# Patient Record
Sex: Male | Born: 1950 | Race: White | Hispanic: No | Marital: Married | Smoking: Never smoker
Health system: Southern US, Community
[De-identification: ages and names within clinical notes are randomized; demographics above are authoritative.]

## PROBLEM LIST (undated history)

## (undated) DIAGNOSIS — H269 Unspecified cataract: Secondary | ICD-10-CM

## (undated) DIAGNOSIS — R7303 Prediabetes: Secondary | ICD-10-CM

## (undated) DIAGNOSIS — Z8719 Personal history of other diseases of the digestive system: Secondary | ICD-10-CM

## (undated) DIAGNOSIS — Z9289 Personal history of other medical treatment: Secondary | ICD-10-CM

## (undated) DIAGNOSIS — T7840XA Allergy, unspecified, initial encounter: Secondary | ICD-10-CM

## (undated) DIAGNOSIS — M1990 Unspecified osteoarthritis, unspecified site: Secondary | ICD-10-CM

## (undated) HISTORY — DX: Unspecified cataract: H26.9

## (undated) HISTORY — PX: HAND SURGERY: SHX662

## (undated) HISTORY — PX: BUNIONECTOMY: SHX129

## (undated) HISTORY — PX: HAMMER TOE SURGERY: SHX385

## (undated) HISTORY — PX: COLONOSCOPY: SHX174

## (undated) HISTORY — PX: TONSILLECTOMY: SUR1361

## (undated) HISTORY — DX: Allergy, unspecified, initial encounter: T78.40XA

## (undated) HISTORY — PX: EYE SURGERY: SHX253

## (undated) HISTORY — PX: JOINT REPLACEMENT: SHX530

## (undated) HISTORY — PX: KNEE ARTHROSCOPY: SHX127

## (undated) HISTORY — PX: OTHER SURGICAL HISTORY: SHX169

---

## 1981-09-03 DIAGNOSIS — Z9289 Personal history of other medical treatment: Secondary | ICD-10-CM

## 1981-09-03 HISTORY — DX: Personal history of other medical treatment: Z92.89

## 1981-09-03 HISTORY — PX: OTHER SURGICAL HISTORY: SHX169

## 2008-09-03 HISTORY — PX: KNEE ARTHROPLASTY: SHX992

## 2009-03-19 ENCOUNTER — Emergency Department (HOSPITAL_BASED_OUTPATIENT_CLINIC_OR_DEPARTMENT_OTHER): Admission: EM | Admit: 2009-03-19 | Discharge: 2009-03-20 | Payer: Self-pay | Admitting: Emergency Medicine

## 2009-03-19 ENCOUNTER — Ambulatory Visit: Payer: Self-pay | Admitting: Diagnostic Radiology

## 2009-05-02 ENCOUNTER — Inpatient Hospital Stay (HOSPITAL_COMMUNITY): Admission: RE | Admit: 2009-05-02 | Discharge: 2009-05-06 | Payer: Self-pay | Admitting: Orthopedic Surgery

## 2009-05-02 ENCOUNTER — Encounter (INDEPENDENT_AMBULATORY_CARE_PROVIDER_SITE_OTHER): Payer: Self-pay | Admitting: Orthopedic Surgery

## 2009-05-13 ENCOUNTER — Encounter (INDEPENDENT_AMBULATORY_CARE_PROVIDER_SITE_OTHER): Payer: Self-pay | Admitting: Orthopedic Surgery

## 2009-05-13 ENCOUNTER — Ambulatory Visit: Payer: Self-pay | Admitting: Surgery

## 2009-05-13 ENCOUNTER — Ambulatory Visit (HOSPITAL_COMMUNITY): Admission: RE | Admit: 2009-05-13 | Discharge: 2009-05-13 | Payer: Self-pay | Admitting: Orthopedic Surgery

## 2010-12-08 LAB — HEMOGLOBIN AND HEMATOCRIT, BLOOD: Hemoglobin: 12.1 g/dL — ABNORMAL LOW (ref 13.0–17.0)

## 2010-12-08 LAB — PROTIME-INR
INR: 2.1 — ABNORMAL HIGH (ref 0.00–1.49)
INR: 2.6 — ABNORMAL HIGH (ref 0.00–1.49)
Prothrombin Time: 23.2 seconds — ABNORMAL HIGH (ref 11.6–15.2)
Prothrombin Time: 27.8 seconds — ABNORMAL HIGH (ref 11.6–15.2)

## 2010-12-09 LAB — CBC
Platelets: 199 10*3/uL (ref 150–400)
RBC: 5.11 MIL/uL (ref 4.22–5.81)
WBC: 4.3 10*3/uL (ref 4.0–10.5)

## 2010-12-09 LAB — DIFFERENTIAL
Basophils Absolute: 0 10*3/uL (ref 0.0–0.1)
Eosinophils Absolute: 0.2 10*3/uL (ref 0.0–0.7)
Eosinophils Relative: 4 % (ref 0–5)
Lymphocytes Relative: 45 % (ref 12–46)
Lymphs Abs: 1.9 10*3/uL (ref 0.7–4.0)
Monocytes Absolute: 0.4 10*3/uL (ref 0.1–1.0)

## 2010-12-09 LAB — TYPE AND SCREEN
ABO/RH(D): B POS
Antibody Screen: NEGATIVE

## 2010-12-09 LAB — ABO/RH: ABO/RH(D): B POS

## 2010-12-09 LAB — URINALYSIS, ROUTINE W REFLEX MICROSCOPIC
Glucose, UA: NEGATIVE mg/dL
Hgb urine dipstick: NEGATIVE
pH: 5.5 (ref 5.0–8.0)

## 2010-12-09 LAB — COMPREHENSIVE METABOLIC PANEL
ALT: 26 U/L (ref 0–53)
AST: 26 U/L (ref 0–37)
Albumin: 4 g/dL (ref 3.5–5.2)
CO2: 27 mEq/L (ref 19–32)
Chloride: 105 mEq/L (ref 96–112)
Creatinine, Ser: 1.26 mg/dL (ref 0.4–1.5)
GFR calc Af Amer: >60 mL/min (ref >60)
GFR calc non Af Amer: 59 mL/min — ABNORMAL LOW (ref >60)
Sodium: 137 mEq/L (ref 135–145)
Total Bilirubin: 1 mg/dL (ref 0.3–1.2)

## 2010-12-09 LAB — PROTIME-INR: Prothrombin Time: 12.9 seconds (ref 11.6–15.2)

## 2010-12-09 LAB — HEMOGLOBIN AND HEMATOCRIT, BLOOD: Hemoglobin: 13.9 g/dL (ref 13.0–17.0)

## 2010-12-10 LAB — DIFFERENTIAL
Basophils Relative: 0 % (ref 0–1)
Eosinophils Relative: 4 % (ref 0–5)
Monocytes Absolute: 0.5 10*3/uL (ref 0.1–1.0)
Monocytes Relative: 9 % (ref 3–12)
Neutro Abs: 2.6 10*3/uL (ref 1.7–7.7)

## 2010-12-10 LAB — BASIC METABOLIC PANEL
CO2: 28 mEq/L (ref 19–32)
Calcium: 8.9 mg/dL (ref 8.4–10.5)
Chloride: 104 mEq/L (ref 96–112)
GFR calc Af Amer: >60 mL/min (ref >60)
Glucose, Bld: 90 mg/dL (ref 70–99)
Potassium: 3.8 mEq/L (ref 3.5–5.1)
Sodium: 142 mEq/L (ref 135–145)

## 2010-12-10 LAB — POCT CARDIAC MARKERS
CKMB, poc: 1.1 ng/mL (ref 1.0–8.0)
CKMB, poc: <1 ng/mL — ABNORMAL LOW (ref 1.0–8.0)
Myoglobin, poc: 41.2 ng/mL (ref 12–200)
Troponin i, poc: <0.05 ng/mL (ref 0.00–0.09)

## 2010-12-10 LAB — CBC
HCT: 48.2 % (ref 39.0–52.0)
Hemoglobin: 16.2 g/dL (ref 13.0–17.0)
MCHC: 33.5 g/dL (ref 30.0–36.0)
MCV: 87.7 fL (ref 78.0–100.0)
RBC: 5.5 MIL/uL (ref 4.22–5.81)

## 2011-01-16 NOTE — Op Note (Signed)
Anthony Kane, Anthony Kane              ACCOUNT NO.:  [id number]   MEDICAL RECORD NO.:  [id number]           PATIENT TYPE:   LOCATION:                                 FACILITY:   PHYSICIAN:  Georges Lynch. Gioffre, M.D.DATE OF BIRTH:  November 30, 1950   DATE OF PROCEDURE:  DATE OF DISCHARGE:                               OPERATIVE REPORT   ASSISTANT:  Dr. Marlowe Kays MD and Rozell Searing, PA.   PREOPERATIVE DIAGNOSES:  1. Large popliteal cyst migrating out medially the joint that had      previously been excised by another surgeon x2.  2. Degenerative arthritis of the right knee.   POSTOPERATIVE DIAGNOSES:  1. Large popliteal cyst migrating out medially the joint that had      previously been excised by another surgeon x2.  2. Degenerative arthritis of the right knee.   OPERATION:  1. Excision of a large popliteal cyst from the medial approach, right      knee.  2. Right total knee arthroplasty utilizing DePuy system.  Sizes used:      The patella 41-mm three-pronged patella.  The tibial tray was a      size 5 cemented, the femoral component size 5 right posterior      stabilized, the insert was a size 5, 10-mm thickness insert      rotating platform type.  In the cement we used gentamicin.   PROCEDURE:  Under general anesthesia routine orthopedic prep and draping  of the right knee was carried out.  The patient given 2 g of IV Ancef  preop.  At this time the leg was exsanguinated with an Esmarch.  Tourniquet was elevated at 350 mmHg.  The knee was flexed.  An incision  was made over the anterior aspect of the left knee.  Bleeders identified  and cauterized.  I dissected medially into the soft tissue area, went  back and excised the large ganglion cyst that was very unusual  presentation since it presented medially.   We then did a median parapatellar incision reflecting the patella  laterally and carried out medial lateral meniscectomy to excise the  anterior and posterior cruciate  ligaments.  Note the anterior cruciate  actually was completely degenerated.  Following that I made my initial  drill hole in the intercondylar notch.  A #1 jig was inserted.  I  removed 12-mm thickness off the distal femur.  At that time we measured  the femur to be a size 5.  At this point we then cut the femur for a  size 5 femur.  We did our anterior-posterior chamfer cuts in the usual  fashion.  Following that we then prepared our tibia for a size 5 tibia.  We utilized the intramedullary guide and I removed 4-mm thickness off  the affected lateral side.  He did have a valgus knee.  At this time we  made sure that the tibial plateau was nice and even.  We measured the  tibial plateau to be a size 5.  We made an initial drill hole in the  tibia followed by the  keel cut out of the proximal tibia.  Before we cut  our notch cut of the femur I checked for any posterior spurs.  We then  began our tension, utilized our tension guides, and made sure we had  good tension in flexion and extension.  Following that I then cut my  notch cut of the distal femur in the usual fashion.  We then inserted  our trial components.  We had excellent stability and excellent tension  utilizing the 10-mm thickness tibial insert with a trial components in I  then did a resurfacing procedure on the patella in the usual fashion.  Three drill holes were made in the patella for a size 41 patella.   I thoroughly irrigated out the area and then removed all trial  components, thoroughly irrigated out the area again, dried the area out,  cemented all three components in simultaneously.  By the way, we used  gentamicin in the cement.  Once the cement was hardened, all loose  pieces of cement were removed.  We checked posteriorly as well.  I  injected 10 mL of FloSeal into the knee after we irrigated the knee out.  I then bone waxed the bleeding bone ends of the femur.  We then checked  again and finally selected the  10-mm thickness tibial insert.   Following that we irrigated the area out and then went ahead and  inserted a Hemovac drain, closed the knee in layered usual fashion.  Sterile Neosporin dressings were applied.  The patient had 2 g of IV  Ancef preop.  Note when we did our ganglion cystectomy I sent the cyst  down to pathology.  We then dressed the wounds as I mentioned.  The  patient left the operating room in satisfactory condition.           ______________________________  Georges Lynch Darrelyn Hillock, M.D.     RAG/MEDQ  D:  05/02/2009  T:  05/02/2009  Job:  [id number]

## 2013-04-22 ENCOUNTER — Other Ambulatory Visit (HOSPITAL_COMMUNITY): Payer: Self-pay | Admitting: Orthopedic Surgery

## 2013-04-22 NOTE — Patient Instructions (Addendum)
20 Anthony Kane  04/22/2013   Your procedure is scheduled on: 05-08-2013  Report to [facility] at  800 AM.  Call this number if you have problems the morning of surgery [phone]   Remember:   Do not eat food or drink liquids :After Midnight.     Take these medicines the morning of surgery with A SIP OF WATER: no meds to take                                SEE [facility] PREPARING FOR SURGERY SHEET   Do not wear jewelry, make-up or nail polish.  Do not wear lotions, powders, or perfumes. You may wear deodorant.   Men may shave face and neck.  Do not bring valuables to the hospital. [facility] IS NOT RESPONSIBLE FOR VALUEABLES.  Contacts, dentures or bridgework may not be worn into surgery.  Leave suitcase in the car. After surgery it may be brought to your room.  For patients admitted to the hospital, checkout time is 11:00 AM the day of discharge.   Patients discharged the day of surgery will not be allowed to drive home.  Name and phone number of your driver:  Special Instructions: N/A   Please read over the following fact sheets that you were given:   Call Cain Sieve RN pre op nurse if needed [phone]    FAILURE TO FOLLOW THESE INSTRUCTIONS MAY RESULT IN THE CANCELLATION OF YOUR SURGERY.  PATIENT SIGNATURE___________________________________________  NURSE SIGNATURE_____________________________________________

## 2013-04-23 ENCOUNTER — Encounter (HOSPITAL_COMMUNITY): Payer: Self-pay

## 2013-04-23 ENCOUNTER — Encounter (HOSPITAL_COMMUNITY): Payer: Self-pay | Admitting: Pharmacy Technician

## 2013-04-23 ENCOUNTER — Encounter (HOSPITAL_COMMUNITY)
Admission: RE | Admit: 2013-04-23 | Discharge: 2013-04-23 | Disposition: A | Discharge status: Home / Self Care | Payer: BC Managed Care – PPO | Source: Ambulatory Visit | Attending: Orthopedic Surgery | Admitting: Orthopedic Surgery

## 2013-04-23 ENCOUNTER — Ambulatory Visit (HOSPITAL_COMMUNITY)
Admission: RE | Admit: 2013-04-23 | Discharge: 2013-04-23 | Disposition: A | Discharge status: Home / Self Care | Payer: BC Managed Care – PPO | Source: Ambulatory Visit | Attending: Surgical | Admitting: Surgical

## 2013-04-23 DIAGNOSIS — Z01818 Encounter for other preprocedural examination: Secondary | ICD-10-CM | POA: Insufficient documentation

## 2013-04-23 DIAGNOSIS — Z01810 Encounter for preprocedural cardiovascular examination: Secondary | ICD-10-CM | POA: Insufficient documentation

## 2013-04-23 HISTORY — DX: Personal history of other medical treatment: Z92.89

## 2013-04-23 HISTORY — DX: Unspecified osteoarthritis, unspecified site: M19.90

## 2013-04-23 LAB — CBC
HCT: 44 % (ref 39.0–52.0)
MCHC: 35.2 g/dL (ref 30.0–36.0)
MCV: 82.1 fL (ref 78.0–100.0)
Platelets: 227 10*3/uL (ref 150–400)
RDW: 12.4 % (ref 11.5–15.5)

## 2013-04-23 LAB — URINALYSIS, ROUTINE W REFLEX MICROSCOPIC
Bilirubin Urine: NEGATIVE
Glucose, UA: NEGATIVE mg/dL
Hgb urine dipstick: NEGATIVE
Ketones, ur: NEGATIVE mg/dL
Leukocytes, UA: NEGATIVE
Nitrite: NEGATIVE
Protein, ur: NEGATIVE mg/dL
Specific Gravity, Urine: 1.024 (ref 1.005–1.030)
Urobilinogen, UA: 0.2 mg/dL (ref 0.0–1.0)
pH: 5.5 (ref 5.0–8.0)

## 2013-04-23 LAB — COMPREHENSIVE METABOLIC PANEL
ALT: 22 U/L (ref 0–53)
AST: 20 U/L (ref 0–37)
Albumin: 3.9 g/dL (ref 3.5–5.2)
Alkaline Phosphatase: 76 U/L (ref 39–117)
BUN: 16 mg/dL (ref 6–23)
CO2: 24 mEq/L (ref 19–32)
Calcium: 9 mg/dL (ref 8.4–10.5)
Chloride: 100 mEq/L (ref 96–112)
Creatinine, Ser: 1.21 mg/dL (ref 0.50–1.35)
GFR calc Af Amer: 73 mL/min — ABNORMAL LOW (ref >90)
GFR calc non Af Amer: 63 mL/min — ABNORMAL LOW (ref >90)
Glucose, Bld: 108 mg/dL — ABNORMAL HIGH (ref 70–99)
Potassium: 4.1 mEq/L (ref 3.5–5.1)
Sodium: 133 mEq/L — ABNORMAL LOW (ref 135–145)
Total Bilirubin: 0.4 mg/dL (ref 0.3–1.2)
Total Protein: 6.6 g/dL (ref 6.0–8.3)

## 2013-04-23 LAB — APTT: aPTT: 30 seconds (ref 24–37)

## 2013-04-23 LAB — PROTIME-INR
INR: 0.95 (ref 0.00–1.49)
Prothrombin Time: 12.5 seconds (ref 11.6–15.2)

## 2013-05-05 NOTE — H&P (Signed)
Anthony Kane is an 62 y.o. male.   Chief Complaint: right shoulder pain HPI: Anthony Kane is a 62 year old male who presented with right shoulder pain. He has been followed for her bilateral shoulder pain for several months. He has developed weakness and increased pain with no known injury over the last 2 months. He has had cortisone injections in the past which have relieved his symptoms. Unfortunately, the cortisone injections have become ineffective for his right shoulder. MRI of the right shoulder revealed that he has a 4.9 x 5.1 cm full thickness supraspinatus and infraspinatus tear. He has a complete rupture of the biceps tendon which is old , that tendon has reattached itself distally.   Past Medical History  Diagnosis Date  . Arthritis   . History of blood transfusion 1983    Past Surgical History  Procedure Laterality Date  . 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 Hand surgery Right     x 9  . Arthroscopy of knee Bilateral     2 x each knee  . Joint replacement Right 2011 or 2010  . Bunion surgery Right   . Hammer toe surgery Right   . Eye surgery Right     intraocular lens  . Tonsillectomy  age 41    Social History:  reports that he has never smoked. He has never used smokeless tobacco. He reports that he does not drink alcohol or use illicit drugs.  Allergies: No Known Allergies   Current outpatient prescriptions: carboxymethylcellulose (REFRESH PLUS) 0.5 % SOLN, Place 1 drop into both eyes 3 (three) times daily as needed (dry eyes)., Disp: , Rfl: ;  cholecalciferol (VITAMIN D) 1000 UNITS tablet, Take 2,000 Units by mouth daily., Disp: , Rfl: ;  fish oil-omega-3 fatty acids 1000 MG capsule, Take 2 g by mouth daily., Disp: , Rfl:  ibuprofen (ADVIL,MOTRIN) 200 MG tablet, Take 400-800 mg by mouth every 6 (six) hours as needed for pain., Disp: , Rfl: ;  Linoleic Acid Conjugated (CLA PO), Take 1 tablet by mouth daily., Disp: , Rfl: ;  Multiple Vitamin (MULTIVITAMIN WITH  MINERALS) TABS tablet, Take 1 tablet by mouth daily., Disp: , Rfl: ;  OVER THE COUNTER MEDICATION, Take 1 tablet by mouth daily. Fruit and vegetable supplement, Disp: , Rfl:   Review of Systems  Constitutional: Negative.   HENT: Negative.  Negative for neck pain.   Eyes: Negative.   Respiratory: Negative.   Cardiovascular: Negative.   Gastrointestinal: Negative.   Genitourinary: Negative.   Musculoskeletal: Positive for myalgias and joint pain. Negative for back pain and falls.       Right shoulder pain  Skin: Negative.   Neurological: Negative.   Endo/Heme/Allergies: Negative.   Psychiatric/Behavioral: Negative.     Physical Exam  Constitutional: He is oriented to person, place, and time. He appears well-developed and well-nourished. No distress.  HENT:  Head: Normocephalic and atraumatic.  Right Ear: External ear normal.  Left Ear: External ear normal.  Nose: Nose normal.  Mouth/Throat: Oropharynx is clear and moist.  Eyes: Conjunctivae and EOM are normal.  Neck: Normal range of motion. Neck supple. No tracheal deviation present. No thyromegaly present.  Cardiovascular: Normal rate, regular rhythm, normal heart sounds and intact distal pulses.   No murmur heard. Respiratory: Effort normal and breath sounds normal. No respiratory distress. He has no wheezes.  GI: Soft. Bowel sounds are normal. He exhibits no distension. There is no tenderness.  Musculoskeletal:  Right shoulder: He exhibits decreased range of motion, tenderness, pain and decreased strength.       Left shoulder: He exhibits pain. He exhibits normal range of motion, no tenderness and normal strength.       Right elbow: Normal.      Left elbow: Normal.       Right wrist: Normal.       Left wrist: Normal.       Cervical back: Normal.  Decreased strength in abduction. Popeye deformity in the right UE due to biceps tendon tear  Lymphadenopathy:    He has no cervical adenopathy.  Neurological: He is alert and  oriented to person, place, and time. He has normal reflexes. No sensory deficit.  Skin: No rash noted. He is not diaphoretic. No erythema.  Psychiatric: He has a normal mood and affect. His behavior is normal.     Assessment/Plan Right shoulder, rotator cuff tear and biceps tendon tear He needs to have that biceps tendon opened and repaired. We may need to use a tendon graft. Dr. Darrelyn Hillock may have to use anchors to anchor that back to the bone. The overall success rate depends totally on how well that tendon presents. In other words if it is in good enough shape to reattach. The tendon could be all arthritic and there could not be much good tendon left to repair. Possible other complications could be infection which is rare. He will stay overnight for this procedure.   Alysabeth Scalia LAUREN 05/05/2013, 9:11 AM

## 2013-05-08 ENCOUNTER — Encounter (HOSPITAL_COMMUNITY): Payer: Self-pay | Admitting: Anesthesiology

## 2013-05-08 ENCOUNTER — Ambulatory Visit (HOSPITAL_COMMUNITY): Payer: BC Managed Care – PPO | Admitting: Anesthesiology

## 2013-05-08 ENCOUNTER — Observation Stay (HOSPITAL_COMMUNITY)
Admission: RE | Admit: 2013-05-08 | Discharge: 2013-05-09 | Disposition: A | Discharge status: Home / Self Care | Payer: BC Managed Care – PPO | Source: Ambulatory Visit | Attending: Orthopedic Surgery | Admitting: Orthopedic Surgery

## 2013-05-08 ENCOUNTER — Encounter (HOSPITAL_COMMUNITY)
Admission: RE | Disposition: A | Discharge status: Home / Self Care | Payer: Self-pay | Source: Ambulatory Visit | Attending: Orthopedic Surgery

## 2013-05-08 DIAGNOSIS — M719 Bursopathy, unspecified: Principal | ICD-10-CM | POA: Insufficient documentation

## 2013-05-08 DIAGNOSIS — M67919 Unspecified disorder of synovium and tendon, unspecified shoulder: Principal | ICD-10-CM | POA: Insufficient documentation

## 2013-05-08 DIAGNOSIS — M75120 Complete rotator cuff tear or rupture of unspecified shoulder, not specified as traumatic: Secondary | ICD-10-CM | POA: Diagnosis present

## 2013-05-08 DIAGNOSIS — S68118A Complete traumatic metacarpophalangeal amputation of other finger, initial encounter: Secondary | ICD-10-CM | POA: Insufficient documentation

## 2013-05-08 DIAGNOSIS — M25819 Other specified joint disorders, unspecified shoulder: Secondary | ICD-10-CM | POA: Insufficient documentation

## 2013-05-08 DIAGNOSIS — E669 Obesity, unspecified: Secondary | ICD-10-CM | POA: Insufficient documentation

## 2013-05-08 DIAGNOSIS — M75121 Complete rotator cuff tear or rupture of right shoulder, not specified as traumatic: Secondary | ICD-10-CM

## 2013-05-08 HISTORY — PX: SHOULDER OPEN ROTATOR CUFF REPAIR: SHX2407

## 2013-05-08 SURGERY — REPAIR, ROTATOR CUFF, OPEN
Anesthesia: General | Site: Shoulder | Laterality: Right | Wound class: Clean

## 2013-05-08 MED ORDER — THROMBIN 5000 UNITS EX SOLR
CUTANEOUS | Status: AC
  Filled 2013-05-08: qty 5000

## 2013-05-08 MED ORDER — PROPOFOL 10 MG/ML IV BOLUS
INTRAVENOUS | Status: DC | PRN
  Administered 2013-05-08: 200 mg via INTRAVENOUS

## 2013-05-08 MED ORDER — HYDROMORPHONE HCL PF 1 MG/ML IJ SOLN
INTRAMUSCULAR | Status: AC
  Filled 2013-05-08: qty 1

## 2013-05-08 MED ORDER — NEOSTIGMINE METHYLSULFATE 1 MG/ML IJ SOLN
INTRAMUSCULAR | Status: DC | PRN
  Administered 2013-05-08: 4 mg via INTRAVENOUS

## 2013-05-08 MED ORDER — LACTATED RINGERS IV SOLN
INTRAVENOUS | Status: DC
  Administered 2013-05-08: 10:00:00 via INTRAVENOUS

## 2013-05-08 MED ORDER — CISATRACURIUM BESYLATE (PF) 10 MG/5ML IV SOLN
INTRAVENOUS | Status: DC | PRN
  Administered 2013-05-08: 6 mg via INTRAVENOUS

## 2013-05-08 MED ORDER — CEFAZOLIN SODIUM 1-5 GM-% IV SOLN
1.0000 g | Freq: Four times a day (QID) | INTRAVENOUS | Status: AC
  Administered 2013-05-08 – 2013-05-09 (×3): 1 g via INTRAVENOUS
  Filled 2013-05-08 (×3): qty 50

## 2013-05-08 MED ORDER — CEFAZOLIN SODIUM-DEXTROSE 2-3 GM-% IV SOLR
INTRAVENOUS | Status: AC
  Filled 2013-05-08: qty 50

## 2013-05-08 MED ORDER — BUPIVACAINE LIPOSOME 1.3 % IJ SUSP
20.0000 mL | Freq: Once | INTRAMUSCULAR | Status: DC
  Filled 2013-05-08: qty 20

## 2013-05-08 MED ORDER — SODIUM CHLORIDE 0.9 % IR SOLN
Status: DC | PRN
  Administered 2013-05-08: 11:00:00

## 2013-05-08 MED ORDER — METHOCARBAMOL 500 MG PO TABS
500.0000 mg | ORAL_TABLET | Freq: Four times a day (QID) | ORAL | Status: DC | PRN
  Administered 2013-05-08 – 2013-05-09 (×2): 500 mg via ORAL
  Filled 2013-05-08 (×2): qty 1

## 2013-05-08 MED ORDER — OXYCODONE-ACETAMINOPHEN 5-325 MG PO TABS
1.0000 | ORAL_TABLET | ORAL | Status: DC | PRN
  Administered 2013-05-08 – 2013-05-09 (×2): 2 via ORAL
  Filled 2013-05-08 (×2): qty 2

## 2013-05-08 MED ORDER — MIDAZOLAM HCL 5 MG/5ML IJ SOLN
INTRAMUSCULAR | Status: DC | PRN
  Administered 2013-05-08: 2 mg via INTRAVENOUS

## 2013-05-08 MED ORDER — GLYCOPYRROLATE 0.2 MG/ML IJ SOLN
INTRAMUSCULAR | Status: DC | PRN
  Administered 2013-05-08: .6 mg via INTRAVENOUS

## 2013-05-08 MED ORDER — ONDANSETRON HCL 4 MG/2ML IJ SOLN: 4.0000 mg | Freq: Four times a day (QID) | INTRAMUSCULAR | Status: DC | PRN

## 2013-05-08 MED ORDER — ONDANSETRON HCL 4 MG PO TABS: 4.0000 mg | ORAL_TABLET | Freq: Four times a day (QID) | ORAL | Status: DC | PRN

## 2013-05-08 MED ORDER — HYDROMORPHONE HCL PF 1 MG/ML IJ SOLN
0.5000 mg | INTRAMUSCULAR | Status: DC | PRN
  Administered 2013-05-08: 1 mg via INTRAVENOUS
  Filled 2013-05-08: qty 1

## 2013-05-08 MED ORDER — ONDANSETRON HCL 4 MG/2ML IJ SOLN
INTRAMUSCULAR | Status: DC | PRN
  Administered 2013-05-08: 4 mg via INTRAVENOUS

## 2013-05-08 MED ORDER — HYDROCODONE-ACETAMINOPHEN 7.5-325 MG PO TABS
1.0000 | ORAL_TABLET | ORAL | Status: DC | PRN
  Administered 2013-05-08 – 2013-05-09 (×2): 2 via ORAL
  Filled 2013-05-08 (×2): qty 2

## 2013-05-08 MED ORDER — FENTANYL CITRATE 0.05 MG/ML IJ SOLN
INTRAMUSCULAR | Status: DC | PRN
  Administered 2013-05-08: 50 ug via INTRAVENOUS
  Administered 2013-05-08: 100 ug via INTRAVENOUS
  Administered 2013-05-08 (×2): 50 ug via INTRAVENOUS

## 2013-05-08 MED ORDER — LACTATED RINGERS IV SOLN
INTRAVENOUS | Status: DC
  Administered 2013-05-08 – 2013-05-09 (×2): via INTRAVENOUS

## 2013-05-08 MED ORDER — CEFAZOLIN SODIUM-DEXTROSE 2-3 GM-% IV SOLR
2.0000 g | INTRAVENOUS | Status: AC
  Administered 2013-05-08: 2 g via INTRAVENOUS

## 2013-05-08 MED ORDER — THROMBIN 5000 UNITS EX SOLR
OROMUCOSAL | Status: DC | PRN
  Administered 2013-05-08: 12:00:00 via TOPICAL

## 2013-05-08 MED ORDER — ACETAMINOPHEN 325 MG PO TABS: 650.0000 mg | ORAL_TABLET | Freq: Four times a day (QID) | ORAL | Status: DC | PRN

## 2013-05-08 MED ORDER — METHOCARBAMOL 100 MG/ML IJ SOLN
500.0000 mg | Freq: Four times a day (QID) | INTRAVENOUS | Status: DC | PRN
  Administered 2013-05-08: 500 mg via INTRAVENOUS
  Filled 2013-05-08: qty 5

## 2013-05-08 MED ORDER — HYDROMORPHONE HCL PF 1 MG/ML IJ SOLN
0.2500 mg | INTRAMUSCULAR | Status: DC | PRN
  Administered 2013-05-08 (×3): 0.5 mg via INTRAVENOUS

## 2013-05-08 MED ORDER — PROMETHAZINE HCL 25 MG/ML IJ SOLN: 6.2500 mg | INTRAMUSCULAR | Status: DC | PRN

## 2013-05-08 MED ORDER — MENTHOL 3 MG MT LOZG
1.0000 | LOZENGE | OROMUCOSAL | Status: DC | PRN
  Filled 2013-05-08: qty 9

## 2013-05-08 MED ORDER — PHENOL 1.4 % MT LIQD
1.0000 | OROMUCOSAL | Status: DC | PRN
  Filled 2013-05-08: qty 177

## 2013-05-08 MED ORDER — BUPIVACAINE LIPOSOME 1.3 % IJ SUSP
INTRAMUSCULAR | Status: DC | PRN
  Administered 2013-05-08: 20 mL

## 2013-05-08 MED ORDER — ACETAMINOPHEN 650 MG RE SUPP: 650.0000 mg | Freq: Four times a day (QID) | RECTAL | Status: DC | PRN

## 2013-05-08 MED ORDER — BUPIVACAINE-EPINEPHRINE PF 0.25-1:200000 % IJ SOLN
INTRAMUSCULAR | Status: AC
  Filled 2013-05-08: qty 30

## 2013-05-08 SURGICAL SUPPLY — 46 items
ANCHOR PEEK ZIP 5.5 NDL NO2 (Orthopedic Implant) ×2 IMPLANT
BAG ZIPLOCK 12X15 (MISCELLANEOUS) IMPLANT
BLADE OSCILLATING/SAGITTAL (BLADE) ×1
BLADE SW THK.38XMED LNG THN (BLADE) ×1 IMPLANT
BNDG COHESIVE 6X5 TAN NS LF (GAUZE/BANDAGES/DRESSINGS) ×2 IMPLANT
BUR OVAL CARBIDE 4.0 (BURR) ×2 IMPLANT
CLEANER TIP ELECTROSURG 2X2 (MISCELLANEOUS) ×2 IMPLANT
CLOTH BEACON ORANGE TIMEOUT ST (SAFETY) ×2 IMPLANT
DERMABOND ADVANCED (GAUZE/BANDAGES/DRESSINGS) ×1
DERMABOND ADVANCED .7 DNX12 (GAUZE/BANDAGES/DRESSINGS) ×1 IMPLANT
DRAPE INCISE IOBAN 66X45 STRL (DRAPES) ×2 IMPLANT
DRAPE POUCH INSTRU U-SHP 10X18 (DRAPES) ×2 IMPLANT
DRSG AQUACEL AG ADV 3.5X 6 (GAUZE/BANDAGES/DRESSINGS) ×2 IMPLANT
DRSG PAD ABDOMINAL 8X10 ST (GAUZE/BANDAGES/DRESSINGS) IMPLANT
DURAPREP 26ML APPLICATOR (WOUND CARE) ×2 IMPLANT
ELECT REM PT RETURN 9FT ADLT (ELECTROSURGICAL) ×2
ELECTRODE REM PT RTRN 9FT ADLT (ELECTROSURGICAL) ×1 IMPLANT
GLOVE BIOGEL PI IND STRL 8 (GLOVE) ×1 IMPLANT
GLOVE BIOGEL PI INDICATOR 8 (GLOVE) ×1
GLOVE ECLIPSE 8.0 STRL XLNG CF (GLOVE) ×2 IMPLANT
GLOVE SURG SS PI 6.5 STRL IVOR (GLOVE) ×4 IMPLANT
GOWN PREVENTION PLUS LG XLONG (DISPOSABLE) ×2 IMPLANT
GOWN STRL REIN XL XLG (GOWN DISPOSABLE) ×2 IMPLANT
KIT BASIN OR (CUSTOM PROCEDURE TRAY) ×2 IMPLANT
MANIFOLD NEPTUNE II (INSTRUMENTS) ×2 IMPLANT
NEEDLE MA TROC 1/2 (NEEDLE) IMPLANT
NS IRRIG 1000ML POUR BTL (IV SOLUTION) IMPLANT
PACK SHOULDER CUSTOM OPM052 (CUSTOM PROCEDURE TRAY) ×2 IMPLANT
PASSER SUT SWANSON 36MM LOOP (INSTRUMENTS) IMPLANT
POSITIONER SURGICAL ARM (MISCELLANEOUS) IMPLANT
SLING ARM IMMOBILIZER LRG (SOFTGOODS) ×2 IMPLANT
SPONGE LAP 4X18 X RAY DECT (DISPOSABLE) ×4 IMPLANT
SPONGE SURGIFOAM ABS GEL 100 (HEMOSTASIS) IMPLANT
STAPLER VISISTAT 35W (STAPLE) IMPLANT
STRIP CLOSURE SKIN 1/2X4 (GAUZE/BANDAGES/DRESSINGS) IMPLANT
SUCTION FRAZIER 12FR DISP (SUCTIONS) ×2 IMPLANT
SUT BONE WAX W31G (SUTURE) ×2 IMPLANT
SUT ETHIBOND NAB CT1 #1 30IN (SUTURE) ×2 IMPLANT
SUT MNCRL AB 4-0 PS2 18 (SUTURE) ×2 IMPLANT
SUT VIC AB 0 CT1 27 (SUTURE)
SUT VIC AB 0 CT1 27XBRD ANTBC (SUTURE) IMPLANT
SUT VIC AB 1 CT1 27 (SUTURE) ×2
SUT VIC AB 1 CT1 27XBRD ANTBC (SUTURE) ×2 IMPLANT
SUT VIC AB 2-0 CT1 27 (SUTURE) ×2
SUT VIC AB 2-0 CT1 27XBRD (SUTURE) ×2 IMPLANT
TOWEL OR 17X26 10 PK STRL BLUE (TOWEL DISPOSABLE) ×2 IMPLANT

## 2013-05-08 NOTE — Progress Notes (Signed)
Utliization review completed

## 2013-05-08 NOTE — Interval H&P Note (Signed)
History and Physical Interval Note:  05/08/2013 10:36 AM  Anthony Kane  has presented today for surgery, with the diagnosis of RIGHT SHOULDER ROTATOR CUFF TEAR  The various methods of treatment have been discussed with the patient and family. After consideration of risks, benefits and other options for treatment, the patient has consented to  Procedure(s): RIGHT SHOULDER OPEN ROTATOR CUFF REPAIR (Right) as a surgical intervention .  The patient's history has been reviewed, patient examined, no change in status, stable for surgery.  I have reviewed the patient's chart and labs.  Questions were answered to the patient's satisfaction.     Rachel Rison A   

## 2013-05-08 NOTE — Interval H&P Note (Signed)
History and Physical Interval Note:  05/08/2013 10:36 AM  Anthony Kane  has presented today for surgery, with the diagnosis of RIGHT SHOULDER ROTATOR CUFF TEAR  The various methods of treatment have been discussed with the patient and family. After consideration of risks, benefits and other options for treatment, the patient has consented to  Procedure(s): RIGHT SHOULDER OPEN ROTATOR CUFF REPAIR (Right) as a surgical intervention .  The patient's history has been reviewed, patient examined, no change in status, stable for surgery.  I have reviewed the patient's chart and labs.  Questions were answered to the patient's satisfaction.     [name]

## 2013-05-08 NOTE — Preoperative (Signed)
Beta Blockers   Reason not to administer Beta Blockers:Not Applicable 

## 2013-05-08 NOTE — Brief Op Note (Signed)
05/08/2013  12:00 PM  PATIENT:  Anthony Kane  62 y.o. male  PRE-OPERATIVE DIAGNOSIS:  RIGHT SHOULDER ROTATOR CUFF TEAR,Complete,Complex  POST-OPERATIVE DIAGNOSIS:  RIGHT SHOULDER ROTATOR CUFF TEAR,SEVERE,COMPLETE,RETRACTED  PROCEDURE:  Procedure(s): RIGHT SHOULDER OPEN ROTATOR CUFF REPAIR (Right),of a SEVERE,COMPLETE,RETEACTED TEAR. Open Acromionectomy.  SURGEON:  Surgeon(s) and Role:    * Jacki Cones, MD - Primary  PHYSICIAN ASSISTANT: Dimitri Ped PA  ASSISTANTS: Dimitri Ped PA   ANESTHESIA:   general  EBL:  Total I/O In: -  Out: 75 [Blood:75]  BLOOD ADMINISTERED:none  DRAINS: none   LOCAL MEDICATIONS USED:  BUPIVICAINE 20cc  SPECIMEN:  No Specimen  DISPOSITION OF SPECIMEN:  N/A  COUNTS:  YES  TOURNIQUET:  * No tourniquets in log *  DICTATION: .Other Dictation: Dictation Number [id number]  PLAN OF CARE: Admit for overnight observation  PATIENT DISPOSITION:  Post-Op   Delay start of Pharmacological VTE agent (>24hrs) due to surgical blood loss or risk of bleeding: yes

## 2013-05-08 NOTE — Anesthesia Preprocedure Evaluation (Signed)
Anesthesia Evaluation  Patient identified by MRN, date of birth, ID band Patient awake    Reviewed: Allergy & Precautions, H&P , NPO status , Patient's Chart, lab work & pertinent test results  Airway Mallampati: II TM Distance: >3 FB Neck ROM: Full    Dental no notable dental hx.    Pulmonary neg pulmonary ROS,  breath sounds clear to auscultation  Pulmonary exam normal       Cardiovascular Exercise Tolerance: Good negative cardio ROS  Rhythm:Regular Rate:Normal     Neuro/Psych negative neurological ROS  negative psych ROS   GI/Hepatic negative GI ROS, Neg liver ROS,   Endo/Other  negative endocrine ROS  Renal/GU negative Renal ROS  negative genitourinary   Musculoskeletal negative musculoskeletal ROS (+)   Abdominal (+) + obese,   Peds negative pediatric ROS (+)  Hematology negative hematology ROS (+)   Anesthesia Other Findings   Reproductive/Obstetrics negative OB ROS                           Anesthesia Physical Anesthesia Plan  ASA: II  Anesthesia Plan: General   Post-op Pain Management:    Induction: Intravenous  Airway Management Planned: Oral ETT  Additional Equipment:   Intra-op Plan:   Post-operative Plan: Extubation in OR  Informed Consent: I have reviewed the patients History and Physical, chart, labs and discussed the procedure including the risks, benefits and alternatives for the proposed anesthesia with the patient or authorized representative who has indicated his/her understanding and acceptance.   Dental advisory given  Plan Discussed with: CRNA  Anesthesia Plan Comments:         Anesthesia Quick Evaluation

## 2013-05-08 NOTE — Transfer of Care (Signed)
Immediate Anesthesia Transfer of Care Note  Patient: Anthony Kane  Procedure(s) Performed: Procedure(s): RIGHT SHOULDER OPEN ROTATOR CUFF REPAIR (Right)  Patient Location: PACU  Anesthesia Type:General  Level of Consciousness: awake, alert  and patient cooperative  Airway & Oxygen Therapy: Patient Spontanous Breathing and Patient connected to face mask oxygen  Post-op Assessment: Report given to PACU RN and Post -op Vital signs reviewed and stable  Post vital signs: Reviewed and stable  Complications: No apparent anesthesia complications

## 2013-05-08 NOTE — Op Note (Signed)
Anthony Kane, Anthony Kane              ACCOUNT NO.:  [id number]  MEDICAL RECORD NO.:  [id number]  LOCATION:  1616                         FACILITY:  [facility]  PHYSICIAN:  Georges Lynch. Germaine Shenker, M.D.DATE OF BIRTH:  Apr 28, 1951  DATE OF PROCEDURE:  05/08/2013 DATE OF DISCHARGE:                              OPERATIVE REPORT   SURGEON:  Georges Lynch. Marylynne Keelin, MD  OPERATIVE ASSISTANT:  Dimitri Ped, PA  PREOPERATIVE DIAGNOSES: 1. Complete retracted tear of the right rotator cuff tendon. 2. Severe impingement syndrome, right shoulder. 3. 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  POSTOPERATIVE DIAGNOSES: 1. Complete retracted tear of the right rotator cuff tendon. 2. Severe impingement syndrome, right shoulder. 3. 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  OPERATION: 1. Open acromionectomy, right shoulder. 2. Repair of a portion.  There was only a portion of his rotator cuff     left medially with 1 anchor.  This was a complex retracted rotator     cuff tear.  DESCRIPTION OF PROCEDURE:  Under general anesthesia, routine orthopedic prep and draping of the right upper extremity was carried out with the patient in a beach chair position.  He had 2 g IV Ancef.  At this time, the appropriate time-out was carried out in the usual fashion.  I also marked the appropriate right arm in the holding area.  After sterile prep and draping, an incision was made over the anterior aspect of the right shoulder.  Bleeders were identified and cauterized.  I then went down, identified the acromion.  I separated the deltoid tendon from the acromion by sharp dissection.  I split a small part of the deltoid tendon muscle proximally and immediately upon going down to the shoulder, luminous amount of fluid came out through the incision.  This was all serous fluid.  No signs of infection.  He had a marked overgrowth and thickening of his acromion. I protected the underlying humeral head  with a Bennett retractor, utilized oscillating saw, did a partial acromionectomy and then utilized a bur to complete the acromioplasty.  Once this was done, I had good visualization of the humeral head.  Note, there was marked arthritic changes laterally.  The cuff was completely disintegrated.  I searched high and wide for this cuff.  I went laterally, superiorly, posteriorly, and medially.  I found a small part of the cup that I could advance forward.  I inserted 1 anchor in the proximal humerus, brought that portion of the cuff forward and anchored it in place.  There was no other remaining portion of the cuff present.  He had a large bald area of the humeral head.  Note, I made multiple searches for the remaining part of the rotator cuff and there was none.  It was basically disintegrated.  The long head of the biceps was completely gone too with a chronic full thickness tear of that in the past.  I thoroughly irrigated out the area of bone, waxed the undersurface of the acromion.  I then inserted some thrombin-soaked Gelfoam and reapproximated the deltoid tendon muscle in usual fashion. I injected 20 mL of Exparel into the muscle.  Remaining part of  the wound was closed in usual fashion.  The patient will be placed in a sling.  Note, he eventually will need a reverse shoulder arthroplasty because of the nature of the rotator cuff.          ______________________________ Georges Lynch. Darrelyn Hillock, M.D.     RAG/MEDQ  D:  05/08/2013  T:  05/08/2013  Job:  [id number]

## 2013-05-08 NOTE — Anesthesia Postprocedure Evaluation (Signed)
  Anesthesia Post-op Note  Patient: Anthony Kane  Procedure(s) Performed: Procedure(s) (LRB): RIGHT SHOULDER OPEN ROTATOR CUFF REPAIR (Right)  Patient Location: PACU  Anesthesia Type: General  Level of Consciousness: awake and alert   Airway and Oxygen Therapy: Patient Spontanous Breathing  Post-op Pain: mild  Post-op Assessment: Post-op Vital signs reviewed, Patient's Cardiovascular Status Stable, Respiratory Function Stable, Patent Airway and No signs of Nausea or vomiting  Last Vitals:  Filed Vitals:   05/08/13 1722  BP: 133/75  Pulse: 56  Temp: 36.4 C  Resp: 14    Post-op Vital Signs: stable   Complications: No apparent anesthesia complications

## 2013-05-09 MED ORDER — OXYCODONE-ACETAMINOPHEN 5-325 MG PO TABS: 1.0000 | ORAL_TABLET | ORAL | Status: DC | PRN

## 2013-05-09 MED ORDER — METHOCARBAMOL 500 MG PO TABS: 500.0000 mg | ORAL_TABLET | Freq: Four times a day (QID) | ORAL | Status: DC | PRN

## 2013-05-09 NOTE — Progress Notes (Signed)
   Subjective: 1 Day Post-Op Procedure(s) (LRB): RIGHT SHOULDER OPEN ROTATOR CUFF REPAIR (Right)  Pt c/o mild pain this morning in right shoulder Pt more concerned with lifting weights after his recovery Otherwise doing okay Patient reports pain as mild.  Objective:   VITALS:   Filed Vitals:   05/09/13 0515  BP: 105/67  Pulse: 67  Temp: 98.5 F (36.9 C)  Resp: 20    Right shoulder dressing intact nv intact distally No rashes or edema Sling in place  LABS No results found for this basename: HGB, HCT, WBC, PLT,  in the last 72 hours  No results found for this basename: NA, K, BUN, CREATININE, GLUCOSE,  in the last 72 hours   Assessment/Plan: 1 Day Post-Op Procedure(s) (LRB): RIGHT SHOULDER OPEN ROTATOR CUFF REPAIR (Right)  D/c home today Sling at all times F/u in 2 weeks   Alphonsa Overall, MPAS, PA-C  05/09/2013, 7:50 AM

## 2013-05-09 NOTE — Discharge Summary (Signed)
Physician Discharge Summary   Patient ID: Anthony Kane MRN: [id number] DOB/AGE: Feb 09, 1951 62 y.o.  Admit date: 05/08/2013 Discharge date: 05/09/2013  Admission Diagnoses:  Active Problems:   Complete tear of rotator cuff   Discharge Diagnoses:  Same   Surgeries: Procedure(s): RIGHT SHOULDER OPEN ROTATOR CUFF REPAIR on 05/08/2013   Consultants: none  Discharged Condition: Stable  Hospital Course: Anthony Kane is an 62 y.o. male who was admitted 05/08/2013 with a chief complaint of No chief complaint on file. , and found to have a diagnosis of <principal problem not specified>.  They were brought to the operating room on 05/08/2013 and underwent the above named procedures.    The patient had an uncomplicated hospital course and was stable for discharge.  Recent vital signs:  Filed Vitals:   05/09/13 0515  BP: 105/67  Pulse: 67  Temp: 98.5 F (36.9 C)  Resp: 20    Recent laboratory studies:  Results for orders placed during the hospital encounter of 04/23/13  CBC      Result Value Range   WBC 4.6  4.0 - 10.5 K/uL   RBC 5.36  4.22 - 5.81 MIL/uL   Hemoglobin 15.5  13.0 - 17.0 g/dL   HCT 40.9  81.1 - 91.4 %   MCV 82.1  78.0 - 100.0 fL   MCH 28.9  26.0 - 34.0 pg   MCHC 35.2  30.0 - 36.0 g/dL   RDW 78.2  95.6 - 21.3 %   Platelets 227  150 - 400 K/uL  APTT      Result Value Range   aPTT 30  24 - 37 seconds  COMPREHENSIVE METABOLIC PANEL      Result Value Range   Sodium 133 (*) 135 - 145 mEq/L   Potassium 4.1  3.5 - 5.1 mEq/L   Chloride 100  96 - 112 mEq/L   CO2 24  19 - 32 mEq/L   Glucose, Bld 108 (*) 70 - 99 mg/dL   BUN 16  6 - 23 mg/dL   Creatinine, Ser 0.86  0.50 - 1.35 mg/dL   Calcium 9.0  8.4 - 57.8 mg/dL   Total Protein 6.6  6.0 - 8.3 g/dL   Albumin 3.9  3.5 - 5.2 g/dL   AST 20  0 - 37 U/L   ALT 22  0 - 53 U/L   Alkaline Phosphatase 76  39 - 117 U/L   Total Bilirubin 0.4  0.3 - 1.2 mg/dL   GFR calc non Af Amer 63 (*) >90 mL/min   GFR calc Af Amer 73 (*)  >90 mL/min  PROTIME-INR      Result Value Range   Prothrombin Time 12.5  11.6 - 15.2 seconds   INR 0.95  0.00 - 1.49  URINALYSIS, ROUTINE W REFLEX MICROSCOPIC      Result Value Range   Color, Urine YELLOW  YELLOW   APPearance CLEAR  CLEAR   Specific Gravity, Urine 1.024  1.005 - 1.030   pH 5.5  5.0 - 8.0   Glucose, UA NEGATIVE  NEGATIVE mg/dL   Hgb urine dipstick NEGATIVE  NEGATIVE   Bilirubin Urine NEGATIVE  NEGATIVE   Ketones, ur NEGATIVE  NEGATIVE mg/dL   Protein, ur NEGATIVE  NEGATIVE mg/dL   Urobilinogen, UA 0.2  0.0 - 1.0 mg/dL   Nitrite NEGATIVE  NEGATIVE   Leukocytes, UA NEGATIVE  NEGATIVE    Discharge Medications:     Medication List    ASK your doctor about these  medications       carboxymethylcellulose 0.5 % Soln  Commonly known as:  REFRESH PLUS  Place 1 drop into both eyes 3 (three) times daily as needed (dry eyes).     cholecalciferol 1000 UNITS tablet  Commonly known as:  VITAMIN D  Take 2,000 Units by mouth daily.     CLA PO  Take 1 tablet by mouth daily.     fish oil-omega-3 fatty acids 1000 MG capsule  Take 2 g by mouth daily.     ibuprofen 200 MG tablet  Commonly known as:  ADVIL,MOTRIN  Take 400-800 mg by mouth every 6 (six) hours as needed for pain.     multivitamin with minerals Tabs tablet  Take 1 tablet by mouth daily.     OVER THE COUNTER MEDICATION  Take 1 tablet by mouth daily. Fruit and vegetable supplement        Diagnostic Studies: Dg Chest 2 View  04/23/2013   CLINICAL DATA:  Right rotator cuff surgery, preop.  EXAM: CHEST  2 VIEW  COMPARISON:  03/19/2009  FINDINGS: Mild tortuosity of the thoracic aorta. Heart and mediastinal contours are within normal limits. No focal opacities or effusions. No acute bony abnormality.  IMPRESSION: No active cardiopulmonary disease.   Electronically Signed   By: Charlett Nose   On: 04/23/2013 15:29    Disposition:        Signed: Terriah Reggio B 05/09/2013, 7:51 AM

## 2013-05-09 NOTE — Evaluation (Signed)
Occupational Therapy Evaluation Patient Details Name: Anthony Kane MRN: [id number] DOB: 1950/10/25 Today's Date: 05/09/2013 Time: 4098-1191 OT Time Calculation (min): 19 min  OT Assessment / Plan / Recommendation History of present illness     Clinical Impression   Pt was admitted for R RCR. Tear was complex and retracted and he had an old complete tear of biceps.  A portion of the tear was repaired.  Pt was seen by OT for education about adls and sling.  He will follow up with Dr Darrelyn Hillock for further rehab.      OT Assessment  Progress rehab of shoulder as ordered by MD at follow-up appointment    Follow Up Recommendations   (will follow up with dr Darrelyn Hillock)    Barriers to Discharge      Equipment Recommendations  None recommended by OT    Recommendations for Other Services    Frequency       Precautions / Restrictions Precautions Precautions: Shoulder Type of Shoulder Precautions: sling off for bathing and dressing only Restrictions Weight Bearing Restrictions: No   Pertinent Vitals/Pain 5/10 R shoulder  Repositioned in sling.  Pt wants to d/c asap      ADL  Upper Body Bathing: Moderate assistance Where Assessed - Upper Body Bathing: Unsupported sitting Upper Body Dressing: Maximal assistance Where Assessed - Upper Body Dressing: Unsupported sitting Toilet Transfer: Simulated;Independent Toilet Transfer Method: Sit to Barista:  (walked around bed to recliner) Transfers/Ambulation Related to ADLs: used HOB raised to get to eob ADL Comments: reviewed shoulder protocol and he and wife verbalize understanding.  See education section of chart.  Pt did not have immobilizer belt and asked about this.  I found one in dept, which I gave him. Wife donned sling and observed adl    OT Diagnosis:    OT Problem List:   OT Treatment Interventions:     OT Goals(Current goals can be found in the care plan section)    Visit Information  Last OT Received  On: 05/09/13 Assistance Needed: +1       Prior Functioning     Home Living Family/patient expects to be discharged to:: Private residence Living Arrangements: Spouse/significant other Prior Function Level of Independence: Independent Communication Communication: No difficulties Dominant Hand: Right         Vision/Perception     Cognition  Cognition Arousal/Alertness: Awake/alert Behavior During Therapy: WFL for tasks assessed/performed Overall Cognitive Status: Within Functional Limits for tasks assessed    Extremity/Trunk Assessment Upper Extremity Assessment Upper Extremity Assessment: RUE deficits/detail;LUE deficits/detail RUE Deficits / Details: immobilized due to RCR LUE Deficits / Details: can reach to 90; needs RCR per pt     Mobility Bed Mobility Bed Mobility: Sit to Supine Sit to Supine: With rail;HOB elevated;6: Modified independent (Device/Increase time) Transfers Transfers: Sit to Stand Sit to Stand: 7: Independent     Exercise     Balance     End of Session OT - End of Session Activity Tolerance: Patient tolerated treatment well Patient left: in chair;with call bell/phone within reach;with family/visitor present Nurse Communication:  (ready for discharge)  GO Functional Assessment Tool Used: clinical judgment/observation Functional Limitation: Self care Self Care Current Status (Y7829): At least 60 percent but less than 80 percent impaired, limited or restricted Self Care Goal Status (F6213): At least 60 percent but less than 80 percent impaired, limited or restricted Self Care Discharge Status [phone]): At least 60 percent but less than 80 percent impaired, limited or  restricted   Anthony Kane 05/09/2013, 11:59 AM Marica Otter, OTR/L [phone] 05/09/2013

## 2013-05-09 NOTE — Progress Notes (Signed)
Pt stable, scripts, and d/c instructions given with no questions/concerns voiced by pt or wife.  Pt transported via wheelchair to private vehicle by NT and wife.

## 2013-05-11 ENCOUNTER — Encounter (HOSPITAL_COMMUNITY): Payer: Self-pay | Admitting: Orthopedic Surgery

## 2013-06-25 ENCOUNTER — Ambulatory Visit: Payer: BC Managed Care – PPO | Attending: Orthopedic Surgery | Admitting: Physical Therapy

## 2013-06-25 DIAGNOSIS — M25519 Pain in unspecified shoulder: Secondary | ICD-10-CM | POA: Insufficient documentation

## 2013-06-25 DIAGNOSIS — IMO0001 Reserved for inherently not codable concepts without codable children: Secondary | ICD-10-CM | POA: Insufficient documentation

## 2013-06-25 DIAGNOSIS — M25619 Stiffness of unspecified shoulder, not elsewhere classified: Secondary | ICD-10-CM | POA: Insufficient documentation

## 2013-06-25 DIAGNOSIS — M6281 Muscle weakness (generalized): Secondary | ICD-10-CM | POA: Insufficient documentation

## 2013-06-25 DIAGNOSIS — R609 Edema, unspecified: Secondary | ICD-10-CM | POA: Insufficient documentation

## 2013-06-30 ENCOUNTER — Ambulatory Visit: Payer: BC Managed Care – PPO | Admitting: Rehabilitation

## 2013-07-01 ENCOUNTER — Ambulatory Visit: Payer: BC Managed Care – PPO | Admitting: Physical Therapy

## 2013-07-02 ENCOUNTER — Ambulatory Visit: Payer: BC Managed Care – PPO | Admitting: Physical Therapy

## 2013-07-06 ENCOUNTER — Ambulatory Visit: Payer: BC Managed Care – PPO | Attending: Orthopedic Surgery | Admitting: Physical Therapy

## 2013-07-06 DIAGNOSIS — M25619 Stiffness of unspecified shoulder, not elsewhere classified: Secondary | ICD-10-CM | POA: Insufficient documentation

## 2013-07-06 DIAGNOSIS — R609 Edema, unspecified: Secondary | ICD-10-CM | POA: Insufficient documentation

## 2013-07-06 DIAGNOSIS — IMO0001 Reserved for inherently not codable concepts without codable children: Secondary | ICD-10-CM | POA: Insufficient documentation

## 2013-07-06 DIAGNOSIS — M25519 Pain in unspecified shoulder: Secondary | ICD-10-CM | POA: Insufficient documentation

## 2013-07-06 DIAGNOSIS — M6281 Muscle weakness (generalized): Secondary | ICD-10-CM | POA: Insufficient documentation

## 2013-07-08 ENCOUNTER — Ambulatory Visit: Payer: BC Managed Care – PPO | Admitting: Rehabilitation

## 2013-07-10 ENCOUNTER — Ambulatory Visit: Payer: BC Managed Care – PPO | Admitting: Rehabilitation

## 2013-07-13 ENCOUNTER — Ambulatory Visit: Payer: BC Managed Care – PPO | Admitting: Physical Therapy

## 2013-07-16 ENCOUNTER — Ambulatory Visit: Payer: BC Managed Care – PPO | Admitting: Rehabilitation

## 2013-07-20 ENCOUNTER — Ambulatory Visit: Payer: BC Managed Care – PPO | Admitting: Physical Therapy

## 2013-07-23 ENCOUNTER — Ambulatory Visit: Payer: BC Managed Care – PPO | Admitting: Rehabilitation

## 2013-07-27 ENCOUNTER — Ambulatory Visit: Payer: BC Managed Care – PPO | Admitting: Physical Therapy

## 2013-07-29 ENCOUNTER — Ambulatory Visit: Payer: BC Managed Care – PPO | Admitting: Rehabilitation

## 2013-08-25 ENCOUNTER — Ambulatory Visit: Payer: BC Managed Care – PPO | Attending: Orthopedic Surgery | Admitting: Physical Therapy

## 2013-08-25 DIAGNOSIS — IMO0001 Reserved for inherently not codable concepts without codable children: Secondary | ICD-10-CM | POA: Insufficient documentation

## 2013-08-25 DIAGNOSIS — M25519 Pain in unspecified shoulder: Secondary | ICD-10-CM | POA: Insufficient documentation

## 2013-08-25 DIAGNOSIS — M6281 Muscle weakness (generalized): Secondary | ICD-10-CM | POA: Insufficient documentation

## 2013-08-25 DIAGNOSIS — R609 Edema, unspecified: Secondary | ICD-10-CM | POA: Insufficient documentation

## 2013-08-25 DIAGNOSIS — M25619 Stiffness of unspecified shoulder, not elsewhere classified: Secondary | ICD-10-CM | POA: Insufficient documentation

## 2013-10-26 ENCOUNTER — Ambulatory Visit: Payer: BC Managed Care – PPO | Admitting: Rehabilitation

## 2016-01-01 ENCOUNTER — Encounter (HOSPITAL_BASED_OUTPATIENT_CLINIC_OR_DEPARTMENT_OTHER): Payer: Self-pay | Admitting: *Deleted

## 2016-01-01 ENCOUNTER — Emergency Department (HOSPITAL_BASED_OUTPATIENT_CLINIC_OR_DEPARTMENT_OTHER)
Admission: EM | Admit: 2016-01-01 | Discharge: 2016-01-01 | Disposition: A | Discharge status: Home / Self Care | Payer: BLUE CROSS/BLUE SHIELD | Attending: Emergency Medicine | Admitting: Emergency Medicine

## 2016-01-01 DIAGNOSIS — Y929 Unspecified place or not applicable: Secondary | ICD-10-CM | POA: Insufficient documentation

## 2016-01-01 DIAGNOSIS — S76311A Strain of muscle, fascia and tendon of the posterior muscle group at thigh level, right thigh, initial encounter: Secondary | ICD-10-CM | POA: Diagnosis not present

## 2016-01-01 DIAGNOSIS — X58XXXA Exposure to other specified factors, initial encounter: Secondary | ICD-10-CM | POA: Diagnosis not present

## 2016-01-01 DIAGNOSIS — S8991XA Unspecified injury of right lower leg, initial encounter: Secondary | ICD-10-CM | POA: Diagnosis present

## 2016-01-01 DIAGNOSIS — Y9367 Activity, basketball: Secondary | ICD-10-CM | POA: Diagnosis not present

## 2016-01-01 DIAGNOSIS — M1990 Unspecified osteoarthritis, unspecified site: Secondary | ICD-10-CM | POA: Insufficient documentation

## 2016-01-01 DIAGNOSIS — Y999 Unspecified external cause status: Secondary | ICD-10-CM | POA: Insufficient documentation

## 2016-01-01 MED ORDER — HYDROCODONE-ACETAMINOPHEN 5-325 MG PO TABS
1.0000 | ORAL_TABLET | Freq: Once | ORAL | Status: AC
  Administered 2016-01-01: 1 via ORAL
  Filled 2016-01-01: qty 1

## 2016-01-01 MED ORDER — HYDROCODONE-ACETAMINOPHEN 5-325 MG PO TABS: 1.0000 | ORAL_TABLET | ORAL | Status: DC | PRN

## 2016-01-01 NOTE — ED Notes (Signed)
Pt states he was running while playing basketball and experienced "extreme pain" in the right hamstring area.

## 2016-01-01 NOTE — ED Provider Notes (Signed)
CSN: [id number]     Arrival date & time 01/01/16  2019 History  By signing my name below, I, [facility], attest that this documentation has been prepared under the direction and in the presence of Sherwood Gambler, MD. Electronically Signed: Virgel Bouquet, ED Scribe. 01/01/2016. 10:43 PM.   Chief Complaint  Patient presents with  . Leg Pain   The history is provided by the patient. No language interpreter was used.   HPI Comments: Anthony Kane is a 65 y.o. male who presents to the Emergency Department complaining of intermittent, mild right leg pain onset earlier tonight. Patient states that he was running while playing basketball when he felt pain in his right hamstring. He reports that he feels something shifting in his thigh but he has been able ambulate with crutches despite pain. He has taken ibuprofen but is unsure if this medication provided any relief of pain. He has been seen multiple times in the past by orthopedist Dr. Gladstone Lighter who performed a total knee replacement. Denies hearing a pop. Denies numbness, weakness, leg swelling.  Past Medical History  Diagnosis Date  . Arthritis   . History of blood transfusion 1983   Past Surgical History  Procedure Laterality Date  . 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 Hand surgery Right     x 9  . Arthroscopy of knee Bilateral     2 x each knee  . Joint replacement Right 2011 or 2010  . Bunion surgery Right   . Hammer toe surgery Right   . Eye surgery Right     intraocular lens  . Tonsillectomy  age 56  . Shoulder open rotator cuff repair Right 05/08/2013    Procedure: RIGHT SHOULDER OPEN ROTATOR CUFF REPAIR with ANCHORS;  Surgeon: Tobi Bastos, MD;  Location: [facility];  Service: Orthopedics;  Laterality: Right;   No family history on file. Social History  Substance Use Topics  . Smoking status: Never Smoker   . Smokeless tobacco: Never Used  . Alcohol Use: No    Review of Systems  Cardiovascular:  Negative for leg swelling.  Musculoskeletal: Positive for myalgias.  Neurological: Negative for weakness and numbness.  All other systems reviewed and are negative.  Allergies  Review of patient's allergies indicates no known allergies.  Home Medications   Prior to Admission medications   Medication Sig Start Date End Date Taking? Authorizing Provider  carboxymethylcellulose (REFRESH PLUS) 0.5 % SOLN Place 1 drop into both eyes 3 (three) times daily as needed (dry eyes).    Historical Provider, MD  cholecalciferol (VITAMIN D) 1000 UNITS tablet Take 2,000 Units by mouth daily.    Historical Provider, MD  fish oil-omega-3 fatty acids 1000 MG capsule Take 2 g by mouth daily.    Historical Provider, MD  ibuprofen (ADVIL,MOTRIN) 200 MG tablet Take 400-800 mg by mouth every 6 (six) hours as needed for pain.    Historical Provider, MD  Linoleic Acid Conjugated (CLA PO) Take 1 tablet by mouth daily.    Historical Provider, MD  methocarbamol (ROBAXIN) 500 MG tablet Take 1 tablet (500 mg total) by mouth every 6 (six) hours as needed. 05/09/13   Brad Dixon, PA-C  Multiple Vitamin (MULTIVITAMIN WITH MINERALS) TABS tablet Take 1 tablet by mouth daily.    Historical Provider, MD  OVER THE COUNTER MEDICATION Take 1 tablet by mouth daily. Fruit and vegetable supplement    Historical Provider, MD  oxyCODONE-acetaminophen (PERCOCET/ROXICET) 5-325 MG per tablet Take 1-2  tablets by mouth every 4 (four) hours as needed. 05/09/13   Brad Dixon, PA-C   BP 122/70 mmHg  Pulse 72  Temp(Src) 98.3 F (36.8 C) (Oral)  Resp 18  Ht 6\' 2"  (1.88 m)  Wt 235 lb (106.595 kg)  BMI 30.16 kg/m2  SpO2 96% Physical Exam  Constitutional: He is oriented to person, place, and time. He appears well-developed and well-nourished.  HENT:  Head: Normocephalic and atraumatic.  Right Ear: External ear normal.  Left Ear: External ear normal.  Nose: Nose normal.  Eyes: Right eye exhibits no discharge. Left eye exhibits no discharge.   Cardiovascular: Normal rate and intact distal pulses.   Pulses:      Dorsalis pedis pulses are 2+ on the right side, and 2+ on the left side.  Pulmonary/Chest: Effort normal.  Abdominal: He exhibits no distension.  Musculoskeletal: He exhibits no edema.       Right knee: He exhibits normal range of motion and no swelling. No tenderness found.       Right upper leg: He exhibits tenderness. He exhibits no swelling.       Right lower leg: He exhibits no tenderness.       Legs: Normal strength/sensation in RLE  Neurological: He is alert and oriented to person, place, and time.  Skin: Skin is warm and dry.  Nursing note and vitals reviewed.   ED Course  Procedures   DIAGNOSTIC STUDIES: Oxygen Saturation is 96% on RA, adequate by my interpretation.    COORDINATION OF CARE: 10:36 PM Advised pt to follow-up with Dr. Gladstone Lighter. Advised pt to continue to ice the area and take anti-inflammatory pain medications. Will order and prescribe hydrocodone. Discussed treatment plan with pt at bedside and pt agreed to plan.   MDM   Final diagnoses:  Right hamstring muscle strain, initial encounter    Patient with a right hamstring strain. Is able to walk and I see no obvious swelling or hematoma/ecchymosis. Neurovascularly intact. Likely a partial muscle strain, doubt full tear given no deformity or swelling. He has crutches already. Will advise wrapping, NSAIDs, ice, and elevation. We'll give hydrocodone for breakthrough pain. Will follow-up with his orthopedist.  I personally performed the services described in this documentation, which was scribed in my presence. The recorded information has been reviewed and is accurate.    Sherwood Gambler, MD 01/01/16 [phone]

## 2016-08-09 NOTE — H&P (Signed)
Anthony Kane is an 65 y.o. male.    Chief Complaint: left shoulder pain  HPI: Pt is a 65 y.o. male complaining of left shoulder pain for multiple years. Pain had continually increased since the beginning. X-rays in the clinic show end-stage arthritic changes of the left shoulder. Pt has tried various conservative treatments which have failed to alleviate their symptoms, including injections and therapy. Various options are discussed with the patient. Risks, benefits and expectations were discussed with the patient. Patient understand the risks, benefits and expectations and wishes to proceed with surgery.   PCP:  No PCP Per Patient  D/C Plans: Home  PMH: Past Medical History:  Diagnosis Date  . Arthritis   . History of blood transfusion 1983    PSH: Past Surgical History:  Procedure Laterality Date  . 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 arthroscopy of knee Bilateral    2 x each knee  . bunion surgery Right   . EYE SURGERY Right    intraocular lens  . HAMMER TOE SURGERY Right   . HAND SURGERY Right    x 9  . JOINT REPLACEMENT Right 2011 or 2010  . SHOULDER OPEN ROTATOR CUFF REPAIR Right 05/08/2013   Procedure: RIGHT SHOULDER OPEN ROTATOR CUFF REPAIR with ANCHORS;  Surgeon: Tobi Bastos, MD;  Location: [facility];  Service: Orthopedics;  Laterality: Right;  . TONSILLECTOMY  age 74    Social History:  reports that he has never smoked. He has never used smokeless tobacco. He reports that he does not drink alcohol or use drugs.  Allergies:  No Known Allergies  Medications: No current facility-administered medications for this encounter.    Current Outpatient Prescriptions  Medication Sig Dispense Refill  . carboxymethylcellulose (REFRESH PLUS) 0.5 % SOLN Place 1 drop into both eyes 3 (three) times daily as needed (dry eyes).    . cholecalciferol (VITAMIN D) 1000 UNITS tablet Take 2,000 Units by mouth daily.    . fish oil-omega-3 fatty acids 1000 MG capsule  Take 2 g by mouth daily.    Marland Kitchen HYDROcodone-acetaminophen (NORCO/VICODIN) 5-325 MG tablet Take 1-2 tablets by mouth every 4 (four) hours as needed for severe pain. 15 tablet 0  . ibuprofen (ADVIL,MOTRIN) 200 MG tablet Take 400-800 mg by mouth every 6 (six) hours as needed for pain.    . Linoleic Acid Conjugated (CLA PO) Take 1 tablet by mouth daily.    . methocarbamol (ROBAXIN) 500 MG tablet Take 1 tablet (500 mg total) by mouth every 6 (six) hours as needed. 60 tablet 0  . Multiple Vitamin (MULTIVITAMIN WITH MINERALS) TABS tablet Take 1 tablet by mouth daily.    Marland Kitchen OVER THE COUNTER MEDICATION Take 1 tablet by mouth daily. Fruit and vegetable supplement    . oxyCODONE-acetaminophen (PERCOCET/ROXICET) 5-325 MG per tablet Take 1-2 tablets by mouth every 4 (four) hours as needed. 60 tablet 0    No results found for this or any previous visit (from the past 48 hour(s)). No results found.  ROS: Pain with rom of the left upper extremity  Physical Exam:  Alert and oriented 65 y.o. male in no acute distress Cranial nerves 2-12 intact Cervical spine: full rom with no tenderness, nv intact distally Chest: active breath sounds bilaterally, no wheeze rhonchi or rales Heart: regular rate and rhythm, no murmur Abd: non tender non distended with active bowel sounds Hip is stable with rom  Left shoulder with crepitus and pain with rom nv intact distally  No rashes or edema  Assessment/Plan Assessment: left shoulder end stage osteoarthritis  Plan: Patient will undergo a left total shoulder by Dr. Veverly Fells at [facility]. Risks benefits and expectations were discussed with the patient. Patient understand risks, benefits and expectations and wishes to proceed.

## 2016-08-10 ENCOUNTER — Encounter (HOSPITAL_COMMUNITY): Payer: Self-pay | Admitting: *Deleted

## 2016-08-13 ENCOUNTER — Encounter (HOSPITAL_COMMUNITY): Payer: Self-pay | Admitting: Anesthesiology

## 2016-08-13 ENCOUNTER — Ambulatory Visit (HOSPITAL_COMMUNITY)
Admission: RE | Admit: 2016-08-13 | Discharge: 2016-08-13 | Disposition: A | Discharge status: Home / Self Care | Payer: BLUE CROSS/BLUE SHIELD | Source: Ambulatory Visit | Attending: Orthopedic Surgery | Admitting: Orthopedic Surgery

## 2016-08-13 MED ORDER — FENTANYL CITRATE (PF) 100 MCG/2ML IJ SOLN
INTRAMUSCULAR | Status: AC
  Filled 2016-08-13: qty 2

## 2016-08-13 MED ORDER — PROPOFOL 10 MG/ML IV BOLUS
INTRAVENOUS | Status: AC
  Filled 2016-08-13: qty 40

## 2016-08-13 MED ORDER — MIDAZOLAM HCL 2 MG/2ML IJ SOLN
INTRAMUSCULAR | Status: AC
  Filled 2016-08-13: qty 2

## 2016-08-13 MED ORDER — CHLORHEXIDINE GLUCONATE 4 % EX LIQD: 60.0000 mL | Freq: Once | CUTANEOUS | Status: DC

## 2016-08-13 MED ORDER — CEFAZOLIN SODIUM-DEXTROSE 2-4 GM/100ML-% IV SOLN: 2.0000 g | INTRAVENOUS | Status: DC

## 2016-08-13 NOTE — Progress Notes (Addendum)
Spoke to [place] at [employer] regarding if we could proceed. Anthony Kane reports that they are still waiting on approval. Patient updated, comfort measures offered.

## 2016-08-16 ENCOUNTER — Encounter (HOSPITAL_COMMUNITY): Payer: Self-pay | Admitting: General Practice

## 2016-08-16 NOTE — Progress Notes (Signed)
Pre-op phone call completed.  Patient denies chest pain and shortness of breath.  Patient instructed to have nothing to eat or drink after midnight, including gum and candy.  Patient denies having a cardiologist.

## 2016-08-17 ENCOUNTER — Inpatient Hospital Stay (HOSPITAL_COMMUNITY): Payer: BLUE CROSS/BLUE SHIELD | Admitting: Anesthesiology

## 2016-08-17 ENCOUNTER — Encounter (HOSPITAL_COMMUNITY): Payer: Self-pay | Admitting: *Deleted

## 2016-08-17 ENCOUNTER — Inpatient Hospital Stay (HOSPITAL_COMMUNITY)
Admission: RE | Admit: 2016-08-17 | Discharge: 2016-08-18 | DRG: 483 | Disposition: A | Discharge status: Home / Self Care | Payer: BLUE CROSS/BLUE SHIELD | Source: Ambulatory Visit | Attending: Orthopedic Surgery | Admitting: Orthopedic Surgery

## 2016-08-17 ENCOUNTER — Encounter (HOSPITAL_COMMUNITY)
Admission: RE | Disposition: A | Discharge status: Home / Self Care | Payer: Self-pay | Source: Ambulatory Visit | Attending: Orthopedic Surgery

## 2016-08-17 ENCOUNTER — Inpatient Hospital Stay (HOSPITAL_COMMUNITY): Payer: BLUE CROSS/BLUE SHIELD

## 2016-08-17 ENCOUNTER — Inpatient Hospital Stay: Admit: 2016-08-17 | Payer: BLUE CROSS/BLUE SHIELD | Admitting: Orthopedic Surgery

## 2016-08-17 DIAGNOSIS — Z96611 Presence of right artificial shoulder joint: Secondary | ICD-10-CM | POA: Diagnosis present

## 2016-08-17 DIAGNOSIS — Z89021 Acquired absence of right finger(s): Secondary | ICD-10-CM

## 2016-08-17 DIAGNOSIS — M75102 Unspecified rotator cuff tear or rupture of left shoulder, not specified as traumatic: Secondary | ICD-10-CM | POA: Diagnosis present

## 2016-08-17 DIAGNOSIS — M19012 Primary osteoarthritis, left shoulder: Principal | ICD-10-CM | POA: Diagnosis present

## 2016-08-17 DIAGNOSIS — R200 Anesthesia of skin: Secondary | ICD-10-CM | POA: Diagnosis not present

## 2016-08-17 DIAGNOSIS — Z961 Presence of intraocular lens: Secondary | ICD-10-CM | POA: Diagnosis present

## 2016-08-17 DIAGNOSIS — M25512 Pain in left shoulder: Secondary | ICD-10-CM | POA: Diagnosis present

## 2016-08-17 DIAGNOSIS — Z96612 Presence of left artificial shoulder joint: Secondary | ICD-10-CM

## 2016-08-17 HISTORY — PX: TOTAL SHOULDER ARTHROPLASTY: SHX126

## 2016-08-17 LAB — CBC
HCT: 44.8 % (ref 39.0–52.0)
HEMOGLOBIN: 15.9 g/dL (ref 13.0–17.0)
MCH: 28.4 pg (ref 26.0–34.0)
MCHC: 35.5 g/dL (ref 30.0–36.0)
MCV: 80 fL (ref 78.0–100.0)
Platelets: 217 10*3/uL (ref 150–400)
RBC: 5.6 MIL/uL (ref 4.22–5.81)
RDW: 13.2 % (ref 11.5–15.5)
WBC: 4.4 10*3/uL (ref 4.0–10.5)

## 2016-08-17 LAB — BASIC METABOLIC PANEL
Anion gap: 9 (ref 5–15)
BUN: 16 mg/dL (ref 6–20)
CHLORIDE: 107 mmol/L (ref 101–111)
CO2: 23 mmol/L (ref 22–32)
Calcium: 9.3 mg/dL (ref 8.9–10.3)
Creatinine, Ser: 1.17 mg/dL (ref 0.61–1.24)
GFR calc Af Amer: >60 mL/min (ref >60)
GFR calc non Af Amer: >60 mL/min (ref >60)
GLUCOSE: 106 mg/dL — AB (ref 65–99)
POTASSIUM: 4.3 mmol/L (ref 3.5–5.1)
SODIUM: 139 mmol/L (ref 135–145)

## 2016-08-17 SURGERY — ARTHROPLASTY, SHOULDER, TOTAL
Anesthesia: General | Site: Shoulder | Laterality: Left

## 2016-08-17 SURGERY — ARTHROPLASTY, SHOULDER, TOTAL
Anesthesia: General | Laterality: Left

## 2016-08-17 MED ORDER — ACETAMINOPHEN 325 MG PO TABS: 650.0000 mg | ORAL_TABLET | Freq: Four times a day (QID) | ORAL | Status: DC | PRN

## 2016-08-17 MED ORDER — METHOCARBAMOL 500 MG PO TABS: 500.0000 mg | ORAL_TABLET | Freq: Three times a day (TID) | ORAL | 1 refills | Status: DC | PRN

## 2016-08-17 MED ORDER — PHENOL 1.4 % MT LIQD: 1.0000 | OROMUCOSAL | Status: DC | PRN

## 2016-08-17 MED ORDER — CEFAZOLIN SODIUM-DEXTROSE 2-4 GM/100ML-% IV SOLN
2.0000 g | Freq: Four times a day (QID) | INTRAVENOUS | Status: AC
  Administered 2016-08-17 – 2016-08-18 (×3): 2 g via INTRAVENOUS
  Filled 2016-08-17 (×3): qty 100

## 2016-08-17 MED ORDER — GELATIN ABSORBABLE 12-7 MM EX MISC: CUTANEOUS | Status: DC | PRN

## 2016-08-17 MED ORDER — POLYETHYLENE GLYCOL 3350 17 G PO PACK: 17.0000 g | PACK | Freq: Every day | ORAL | Status: DC | PRN

## 2016-08-17 MED ORDER — OXYCODONE-ACETAMINOPHEN 5-325 MG PO TABS: 1.0000 | ORAL_TABLET | ORAL | 0 refills | Status: DC | PRN

## 2016-08-17 MED ORDER — THROMBIN 5000 UNITS EX SOLR: CUTANEOUS | Status: DC | PRN

## 2016-08-17 MED ORDER — ACETAMINOPHEN 650 MG RE SUPP: 650.0000 mg | Freq: Four times a day (QID) | RECTAL | Status: DC | PRN

## 2016-08-17 MED ORDER — DOCUSATE SODIUM 100 MG PO CAPS
100.0000 mg | ORAL_CAPSULE | Freq: Two times a day (BID) | ORAL | Status: DC
  Administered 2016-08-18: 100 mg via ORAL
  Filled 2016-08-17: qty 1

## 2016-08-17 MED ORDER — SUGAMMADEX SODIUM 200 MG/2ML IV SOLN
INTRAVENOUS | Status: AC
  Filled 2016-08-17: qty 2

## 2016-08-17 MED ORDER — EPHEDRINE SULFATE-NACL 50-0.9 MG/10ML-% IV SOSY
PREFILLED_SYRINGE | INTRAVENOUS | Status: DC | PRN
  Administered 2016-08-17 (×4): 5 mg via INTRAVENOUS

## 2016-08-17 MED ORDER — PROPOFOL 10 MG/ML IV BOLUS
INTRAVENOUS | Status: DC | PRN
  Administered 2016-08-17: 200 mg via INTRAVENOUS

## 2016-08-17 MED ORDER — METOCLOPRAMIDE HCL 5 MG PO TABS: 5.0000 mg | ORAL_TABLET | Freq: Three times a day (TID) | ORAL | Status: DC | PRN

## 2016-08-17 MED ORDER — CEFAZOLIN SODIUM 1 G IJ SOLR
INTRAMUSCULAR | Status: AC
  Filled 2016-08-17: qty 20

## 2016-08-17 MED ORDER — PROMETHAZINE HCL 25 MG/ML IJ SOLN
INTRAMUSCULAR | Status: AC
  Filled 2016-08-17: qty 1

## 2016-08-17 MED ORDER — HYDROMORPHONE HCL 2 MG/ML IJ SOLN: 0.5000 mg | INTRAMUSCULAR | Status: DC | PRN

## 2016-08-17 MED ORDER — ONDANSETRON HCL 4 MG/2ML IJ SOLN
INTRAMUSCULAR | Status: AC
  Filled 2016-08-17: qty 2

## 2016-08-17 MED ORDER — ROCURONIUM BROMIDE 100 MG/10ML IV SOLN
INTRAVENOUS | Status: DC | PRN
  Administered 2016-08-17: 30 mg via INTRAVENOUS

## 2016-08-17 MED ORDER — IBUPROFEN 200 MG PO TABS
400.0000 mg | ORAL_TABLET | Freq: Two times a day (BID) | ORAL | Status: DC | PRN
  Administered 2016-08-18: 400 mg via ORAL
  Filled 2016-08-17: qty 2

## 2016-08-17 MED ORDER — CEFAZOLIN SODIUM-DEXTROSE 2-3 GM-% IV SOLR
2.0000 g | Freq: Once | INTRAVENOUS | Status: DC
  Administered 2016-08-17: 2 g via INTRAVENOUS

## 2016-08-17 MED ORDER — STERILE WATER FOR IRRIGATION IR SOLN
Status: DC | PRN
  Administered 2016-08-17: 1000 mL

## 2016-08-17 MED ORDER — ONDANSETRON HCL 4 MG/2ML IJ SOLN
INTRAMUSCULAR | Status: DC | PRN
  Administered 2016-08-17: 4 mg via INTRAVENOUS

## 2016-08-17 MED ORDER — MIDAZOLAM HCL 2 MG/2ML IJ SOLN
INTRAMUSCULAR | Status: AC
  Administered 2016-08-17: 2 mg
  Filled 2016-08-17: qty 2

## 2016-08-17 MED ORDER — OXYCODONE HCL 5 MG PO TABS
5.0000 mg | ORAL_TABLET | ORAL | Status: DC | PRN
  Administered 2016-08-18: 10 mg via ORAL
  Filled 2016-08-17: qty 2

## 2016-08-17 MED ORDER — SODIUM CHLORIDE 0.9 % IJ SOLN
INTRAMUSCULAR | Status: AC
  Filled 2016-08-17: qty 10

## 2016-08-17 MED ORDER — FENTANYL CITRATE (PF) 100 MCG/2ML IJ SOLN
INTRAMUSCULAR | Status: AC
  Administered 2016-08-17: 50 ug
  Filled 2016-08-17: qty 2

## 2016-08-17 MED ORDER — MIDAZOLAM HCL 2 MG/2ML IJ SOLN
2.0000 mg | Freq: Once | INTRAMUSCULAR | Status: DC
  Filled 2016-08-17: qty 2

## 2016-08-17 MED ORDER — FENTANYL CITRATE (PF) 100 MCG/2ML IJ SOLN
INTRAMUSCULAR | Status: DC | PRN
  Administered 2016-08-17 (×2): 50 ug via INTRAVENOUS

## 2016-08-17 MED ORDER — SUGAMMADEX SODIUM 200 MG/2ML IV SOLN
INTRAVENOUS | Status: DC | PRN
  Administered 2016-08-17: 200 mg via INTRAVENOUS

## 2016-08-17 MED ORDER — ROCURONIUM BROMIDE 50 MG/5ML IV SOSY
PREFILLED_SYRINGE | INTRAVENOUS | Status: AC
  Filled 2016-08-17: qty 10

## 2016-08-17 MED ORDER — BUPIVACAINE-EPINEPHRINE 0.25% -1:200000 IJ SOLN
INTRAMUSCULAR | Status: DC | PRN
  Administered 2016-08-17: 10 mL

## 2016-08-17 MED ORDER — SODIUM CHLORIDE 0.9 % IV SOLN
INTRAVENOUS | Status: DC
  Administered 2016-08-17: 21:00:00 via INTRAVENOUS

## 2016-08-17 MED ORDER — ONDANSETRON HCL 4 MG PO TABS: 4.0000 mg | ORAL_TABLET | Freq: Four times a day (QID) | ORAL | Status: DC | PRN

## 2016-08-17 MED ORDER — LACTATED RINGERS IV SOLN
INTRAVENOUS | Status: DC
  Administered 2016-08-17 (×2): via INTRAVENOUS

## 2016-08-17 MED ORDER — FENTANYL CITRATE (PF) 100 MCG/2ML IJ SOLN: 25.0000 ug | INTRAMUSCULAR | Status: DC | PRN

## 2016-08-17 MED ORDER — PROPOFOL 10 MG/ML IV BOLUS
INTRAVENOUS | Status: AC
  Filled 2016-08-17: qty 20

## 2016-08-17 MED ORDER — FENTANYL CITRATE (PF) 100 MCG/2ML IJ SOLN
100.0000 ug | Freq: Once | INTRAMUSCULAR | Status: DC
  Filled 2016-08-17: qty 2

## 2016-08-17 MED ORDER — THROMBIN 5000 UNITS EX SOLR
CUTANEOUS | Status: AC
  Filled 2016-08-17: qty 5000

## 2016-08-17 MED ORDER — METHOCARBAMOL 500 MG PO TABS
500.0000 mg | ORAL_TABLET | Freq: Four times a day (QID) | ORAL | Status: DC | PRN
  Administered 2016-08-18: 500 mg via ORAL
  Filled 2016-08-17: qty 1

## 2016-08-17 MED ORDER — CEFAZOLIN SODIUM-DEXTROSE 2-4 GM/100ML-% IV SOLN
2.0000 g | INTRAVENOUS | Status: DC
Start: 2016-08-17 — End: 2016-08-17

## 2016-08-17 MED ORDER — ONDANSETRON HCL 4 MG/2ML IJ SOLN: 4.0000 mg | Freq: Four times a day (QID) | INTRAMUSCULAR | Status: DC | PRN

## 2016-08-17 MED ORDER — METOCLOPRAMIDE HCL 5 MG/ML IJ SOLN: 5.0000 mg | Freq: Three times a day (TID) | INTRAMUSCULAR | Status: DC | PRN

## 2016-08-17 MED ORDER — PHENYLEPHRINE HCL 10 MG/ML IJ SOLN
INTRAVENOUS | Status: DC | PRN
  Administered 2016-08-17: 50 ug/min via INTRAVENOUS

## 2016-08-17 MED ORDER — PROMETHAZINE HCL 25 MG/ML IJ SOLN
6.2500 mg | Freq: Once | INTRAMUSCULAR | Status: AC
  Administered 2016-08-17: 6.25 mg via INTRAVENOUS

## 2016-08-17 MED ORDER — 0.9 % SODIUM CHLORIDE (POUR BTL) OPTIME
TOPICAL | Status: DC | PRN
  Administered 2016-08-17: 1000 mL

## 2016-08-17 MED ORDER — METHOCARBAMOL 1000 MG/10ML IJ SOLN
500.0000 mg | Freq: Four times a day (QID) | INTRAVENOUS | Status: DC | PRN
  Filled 2016-08-17: qty 5

## 2016-08-17 MED ORDER — FENTANYL CITRATE (PF) 100 MCG/2ML IJ SOLN
INTRAMUSCULAR | Status: AC
  Filled 2016-08-17: qty 2

## 2016-08-17 MED ORDER — MENTHOL 3 MG MT LOZG: 1.0000 | LOZENGE | OROMUCOSAL | Status: DC | PRN

## 2016-08-17 MED ORDER — LIDOCAINE 2% (20 MG/ML) 5 ML SYRINGE
INTRAMUSCULAR | Status: AC
  Filled 2016-08-17: qty 10

## 2016-08-17 MED ORDER — LIDOCAINE HCL (CARDIAC) 20 MG/ML IV SOLN
INTRAVENOUS | Status: DC | PRN
  Administered 2016-08-17: 100 mg via INTRAVENOUS

## 2016-08-17 SURGICAL SUPPLY — 84 items
BIT DRILL 170X2.5X (BIT) ×1 IMPLANT
BIT DRILL 5/64X5 DISP (BIT) ×3 IMPLANT
BIT DRL 170X2.5X (BIT) ×1
BLADE SAW SAG 73X25 THK (BLADE) ×2
BLADE SAW SGTL 73X25 THK (BLADE) ×1 IMPLANT
BUR SURG 4X8 MED (BURR) IMPLANT
BURR SURG 4MMX8MM MEDIUM (BURR)
BURR SURG 4X8 MED (BURR)
CAPT SHLDR REVTOTAL 1 ×3 IMPLANT
CEMENT BONE DEPUY (Cement) IMPLANT
CLOSURE STERI-STRIP 1/2X4 (GAUZE/BANDAGES/DRESSINGS) ×1
CLOSURE WOUND 1/2 X4 (GAUZE/BANDAGES/DRESSINGS) ×1
CLSR STERI-STRIP ANTIMIC 1/2X4 (GAUZE/BANDAGES/DRESSINGS) ×2 IMPLANT
COVER SURGICAL LIGHT HANDLE (MISCELLANEOUS) ×3 IMPLANT
DRAPE IMP U-DRAPE 54X76 (DRAPES) ×3 IMPLANT
DRAPE INCISE IOBAN 66X45 STRL (DRAPES) ×9 IMPLANT
DRAPE ORTHO SPLIT 77X108 STRL (DRAPES) ×4
DRAPE SURG ORHT 6 SPLT 77X108 (DRAPES) ×2 IMPLANT
DRAPE U-SHAPE 47X51 STRL (DRAPES) ×3 IMPLANT
DRAPE X-RAY CASS 24X20 (DRAPES) IMPLANT
DRILL 2.5 (BIT) ×2
DRSG ADAPTIC 3X8 NADH LF (GAUZE/BANDAGES/DRESSINGS) ×3 IMPLANT
DRSG PAD ABDOMINAL 8X10 ST (GAUZE/BANDAGES/DRESSINGS) ×3 IMPLANT
DURAPREP 26ML APPLICATOR (WOUND CARE) ×3 IMPLANT
ELECT BLADE 4.0 EZ CLEAN MEGAD (MISCELLANEOUS) ×3
ELECT NEEDLE TIP 2.8 STRL (NEEDLE) ×3 IMPLANT
ELECT REM PT RETURN 9FT ADLT (ELECTROSURGICAL) ×3
ELECTRODE BLDE 4.0 EZ CLN MEGD (MISCELLANEOUS) ×1 IMPLANT
ELECTRODE REM PT RTRN 9FT ADLT (ELECTROSURGICAL) ×1 IMPLANT
GAUZE SPONGE 4X4 12PLY STRL (GAUZE/BANDAGES/DRESSINGS) ×3 IMPLANT
GLOVE BIOGEL PI ORTHO PRO 7.5 (GLOVE) ×2
GLOVE BIOGEL PI ORTHO PRO SZ7 (GLOVE) ×2
GLOVE BIOGEL PI ORTHO PRO SZ8 (GLOVE) ×2
GLOVE ORTHO TXT STRL SZ7.5 (GLOVE) ×3 IMPLANT
GLOVE PI ORTHO PRO STRL 7.5 (GLOVE) ×1 IMPLANT
GLOVE PI ORTHO PRO STRL SZ7 (GLOVE) ×1 IMPLANT
GLOVE PI ORTHO PRO STRL SZ8 (GLOVE) ×1 IMPLANT
GLOVE SURG ORTHO 8.5 STRL (GLOVE) ×6 IMPLANT
GOWN STRL REUS W/ TWL XL LVL3 (GOWN DISPOSABLE) ×3 IMPLANT
GOWN STRL REUS W/TWL XL LVL3 (GOWN DISPOSABLE) ×6
HANDPIECE INTERPULSE COAX TIP (DISPOSABLE)
KIT BASIN OR (CUSTOM PROCEDURE TRAY) ×3 IMPLANT
KIT ROOM TURNOVER OR (KITS) ×3 IMPLANT
MANIFOLD NEPTUNE II (INSTRUMENTS) ×3 IMPLANT
NDL SUT 6 .5 CRC .975X.05 MAYO (NEEDLE) ×1 IMPLANT
NEEDLE 1/2 CIR MAYO (NEEDLE) ×3 IMPLANT
NEEDLE HYPO 25GX1X1/2 BEV (NEEDLE) ×3 IMPLANT
NEEDLE MAYO TAPER (NEEDLE) ×2
NS IRRIG 1000ML POUR BTL (IV SOLUTION) ×3 IMPLANT
PACK SHOULDER (CUSTOM PROCEDURE TRAY) ×3 IMPLANT
PACK UNIVERSAL I (CUSTOM PROCEDURE TRAY) ×3 IMPLANT
PAD ARMBOARD 7.5X6 YLW CONV (MISCELLANEOUS) ×6 IMPLANT
PIN GUIDE 1.2 (PIN) ×3 IMPLANT
PIN GUIDE GLENOPHERE 1.5MX300M (PIN) ×3 IMPLANT
PIN METAGLENE 2.5 (PIN) ×3 IMPLANT
SET HNDPC FAN SPRY TIP SCT (DISPOSABLE) IMPLANT
SLING ARM FOAM STRAP LRG (SOFTGOODS) ×3 IMPLANT
SLING ARM IMMOBILIZER LRG (SOFTGOODS) ×3 IMPLANT
SLING ARM IMMOBILIZER MED (SOFTGOODS) IMPLANT
SMARTMIX MINI TOWER (MISCELLANEOUS) ×3
SPONGE LAP 18X18 X RAY DECT (DISPOSABLE) ×3 IMPLANT
SPONGE LAP 4X18 X RAY DECT (DISPOSABLE) ×3 IMPLANT
SPONGE SURGIFOAM ABS GEL SZ50 (HEMOSTASIS) ×3 IMPLANT
STRIP CLOSURE SKIN 1/2X4 (GAUZE/BANDAGES/DRESSINGS) ×2 IMPLANT
SUCTION FRAZIER HANDLE 10FR (MISCELLANEOUS) ×2
SUCTION TUBE FRAZIER 10FR DISP (MISCELLANEOUS) ×1 IMPLANT
SUT FIBERWIRE #2 38 T-5 BLUE (SUTURE) ×9
SUT MNCRL AB 4-0 PS2 18 (SUTURE) ×3 IMPLANT
SUT VIC AB 0 CT1 27 (SUTURE) ×4
SUT VIC AB 0 CT1 27XBRD ANBCTR (SUTURE) ×2 IMPLANT
SUT VIC AB 0 CT2 27 (SUTURE) ×3 IMPLANT
SUT VIC AB 2-0 CT1 27 (SUTURE) ×8
SUT VIC AB 2-0 CT1 27XBRD (SUTURE) ×1 IMPLANT
SUT VIC AB 2-0 CT1 TAPERPNT 27 (SUTURE) ×3 IMPLANT
SUT VICRYL AB 2 0 TIES (SUTURE) ×3 IMPLANT
SUTURE FIBERWR #2 38 T-5 BLUE (SUTURE) ×3 IMPLANT
SYR CONTROL 10ML LL (SYRINGE) ×3 IMPLANT
TAPE CLOTH SURG 6X10 WHT LF (GAUZE/BANDAGES/DRESSINGS) ×3 IMPLANT
TOWEL OR 17X24 6PK STRL BLUE (TOWEL DISPOSABLE) ×3 IMPLANT
TOWEL OR 17X26 10 PK STRL BLUE (TOWEL DISPOSABLE) ×3 IMPLANT
TOWER SMARTMIX MINI (MISCELLANEOUS) ×1 IMPLANT
TRAY FOLEY CATH 16FRSI W/METER (SET/KITS/TRAYS/PACK) IMPLANT
WATER STERILE IRR 1000ML POUR (IV SOLUTION) ×3 IMPLANT
YANKAUER SUCT BULB TIP NO VENT (SUCTIONS) ×3 IMPLANT

## 2016-08-17 NOTE — Anesthesia Procedure Notes (Signed)
Procedure Name: Intubation Date/Time: 08/17/2016 12:18 PM Performed by: Rush Farmer E Pre-anesthesia Checklist: Patient identified, Emergency Drugs available, Suction available, Patient being monitored and Timeout performed Patient Re-evaluated:Patient Re-evaluated prior to inductionOxygen Delivery Method: Circle system utilized Preoxygenation: Pre-oxygenation with 100% oxygen Intubation Type: IV induction Ventilation: Mask ventilation without difficulty Laryngoscope Size: Mac and 4 Grade View: Grade II Tube type: Oral Tube size: 7.5 mm Number of attempts: 1 Airway Equipment and Method: Stylet Placement Confirmation: ETT inserted through vocal cords under direct vision,  positive ETCO2 and breath sounds checked- equal and bilateral Secured at: 21 cm Tube secured with: Tape Dental Injury: Teeth and Oropharynx as per pre-operative assessment

## 2016-08-17 NOTE — Anesthesia Preprocedure Evaluation (Signed)
Anesthesia Evaluation  Patient identified by MRN, date of birth, ID band Patient awake    Reviewed: Allergy & Precautions, H&P , Patient's Chart, lab work & pertinent test results, reviewed documented beta blocker date and time   Airway Mallampati: II  TM Distance: >3 FB Neck ROM: full    Dental no notable dental hx.    Pulmonary    Pulmonary exam normal breath sounds clear to auscultation       Cardiovascular  Rhythm:regular Rate:Normal     Neuro/Psych    GI/Hepatic   Endo/Other    Renal/GU      Musculoskeletal   Abdominal   Peds  Hematology   Anesthesia Other Findings   Reproductive/Obstetrics                             Anesthesia Physical Anesthesia Plan  ASA: II  Anesthesia Plan: General   Post-op Pain Management:  Regional for Post-op pain   Induction: Intravenous  Airway Management Planned: Oral ETT  Additional Equipment:   Intra-op Plan:   Post-operative Plan: Extubation in OR  Informed Consent: I have reviewed the patients History and Physical, chart, labs and discussed the procedure including the risks, benefits and alternatives for the proposed anesthesia with the patient or authorized representative who has indicated his/her understanding and acceptance.   Dental Advisory Given and Dental advisory given  Plan Discussed with: CRNA and Surgeon  Anesthesia Plan Comments: (  Discussed general anesthesia, including possible nausea, instrumentation of airway, sore throat,pulmonary aspiration, etc. I asked if the were any outstanding questions, or  concerns before we proceeded. )        Anesthesia Quick Evaluation

## 2016-08-17 NOTE — Interval H&P Note (Signed)
History and Physical Interval Note:  08/17/2016 11:39 AM  Anthony Kane  has presented today for surgery, with the diagnosis of LEFT SHOULDER OA  The various methods of treatment have been discussed with the patient and family. After consideration of risks, benefits and other options for treatment, the patient has consented to  Procedure(s): TOTAL SHOULDER ARTHROPLASTY/LEFT (Left) as a surgical intervention .  The patient's history has been reviewed, patient examined, no change in status, stable for surgery.  I have reviewed the patient's chart and labs.  Questions were answered to the patient's satisfaction.     Khaliq Turay,STEVEN R

## 2016-08-17 NOTE — Transfer of Care (Signed)
Immediate Anesthesia Transfer of Care Note  Patient: Anthony Kane  Procedure(s) Performed: Procedure(s): left reverse shoulder arthroplasty (Left)  Patient Location: PACU  Anesthesia Type:GA combined with regional for post-op pain  Level of Consciousness: sedated  Airway & Oxygen Therapy: Patient Spontanous Breathing and Patient connected to nasal cannula oxygen  Post-op Assessment: Report given to RN and Post -op Vital signs reviewed and stable  Post vital signs: Reviewed and stable  Last Vitals:  Vitals:   08/17/16 1128  BP: (!) 144/89  Pulse: 75  Resp: 18  Temp: 36.6 C    Last Pain:  Vitals:   08/17/16 1128  TempSrc: Oral      Patients Stated Pain Goal: 3 ([id number])  Complications: No apparent anesthesia complications

## 2016-08-17 NOTE — Progress Notes (Signed)
Spoke with Dr.Norris and ok for pre/clip to be done in the OR. Pt was 45 min late

## 2016-08-17 NOTE — Anesthesia Postprocedure Evaluation (Signed)
Anesthesia Post Note  Patient: Vernis Biagioni  Procedure(s) Performed: Procedure(s) (LRB): left reverse shoulder arthroplasty (Left)  Patient location during evaluation: PACU Anesthesia Type: General and Regional Level of consciousness: awake and alert Pain management: pain level controlled Vital Signs Assessment: post-procedure vital signs reviewed and stable Respiratory status: spontaneous breathing, nonlabored ventilation, respiratory function stable and patient connected to nasal cannula oxygen Cardiovascular status: blood pressure returned to baseline and stable Postop Assessment: no signs of nausea or vomiting Anesthetic complications: no    Last Vitals:  Vitals:   08/17/16 1638 08/17/16 1653  BP: 109/74 111/75  Pulse: 74 73  Resp: 13 14  Temp:      Last Pain:  Vitals:   08/17/16 1128  TempSrc: Oral                 Effie Berkshire

## 2016-08-17 NOTE — Discharge Instructions (Signed)
Ice to the shoulder at all times.  Keep the incision clean and dry and covered for one week, then ok to shower.  Do not lift push or pull with the arm.  Please keep the arm propped on a pillow or a blanket to keep the arm across your waist.  You should be able to see your elbow at all times.  Do not drive until follow up with Dr Veverly Fells.    Follow up in the clinic in two weeks [phone]

## 2016-08-17 NOTE — Anesthesia Procedure Notes (Addendum)
Anesthesia Regional Block:  Interscalene brachial plexus block  Pre-Anesthetic Checklist: ,, timeout performed, Correct Patient, Correct Site, Correct Laterality, Correct Procedure, Correct Position, site marked, Risks and benefits discussed, pre-op evaluation,  At surgeon's request and post-op pain management  Laterality: Left  Prep: chloraprep       Needles:   Needle Type: Echogenic Needle     Needle Length: 9cm 9 cm Needle Gauge: 21 and 21 G    Additional Needles: Interscalene brachial plexus block Narrative:  Start time: 08/17/2016 11:45 AM End time: 08/17/2016 11:53 AM Injection made incrementally with aspirations every 5 mL. Anesthesiologist: Lyndle Herrlich  Additional Notes: 30cc .5% Marcaine epi

## 2016-08-17 NOTE — Brief Op Note (Signed)
08/17/2016  3:56 PM  PATIENT:  Anthony Kane  65 y.o. male  PRE-OPERATIVE DIAGNOSIS:  LEFT SHOULDER OA, END STAGE  POST-OPERATIVE DIAGNOSIS:  LEFT SHOULDER OA, END STAGE, ROTATOR CUFF INSUFFICIENCY  PROCEDURE:  Procedure(s): left reverse shoulder arthroplasty (Left) DePuy Delta Xtend with Subscapularis repair  SURGEON:  Surgeon(s) and Role:    * Netta Cedars, MD - Primary  PHYSICIAN ASSISTANT:   ASSISTANTS: Ventura Bruns, PA-C   ANESTHESIA:   regional and general  EBL:  Total I/O In: 1000 [I.V.:1000] Out: 200 [Blood:200]  BLOOD ADMINISTERED:none  DRAINS: none   LOCAL MEDICATIONS USED:  MARCAINE     SPECIMEN:  No Specimen  DISPOSITION OF SPECIMEN:  N/A  COUNTS:  YES  TOURNIQUET:  * No tourniquets in log *  DICTATION: .Other Dictation: Dictation Number [id number]  PLAN OF CARE: Admit to inpatient   PATIENT DISPOSITION:  PACU - hemodynamically stable.   Delay start of Pharmacological VTE agent (>24hrs) due to surgical blood loss or risk of bleeding: not applicable

## 2016-08-17 NOTE — H&P (View-Only) (Signed)
Anthony Kane is an 65 y.o. male.    Chief Complaint: left shoulder pain  HPI: Pt is a 65 y.o. male complaining of left shoulder pain for multiple years. Pain had continually increased since the beginning. X-rays in the clinic show end-stage arthritic changes of the left shoulder. Pt has tried various conservative treatments which have failed to alleviate their symptoms, including injections and therapy. Various options are discussed with the patient. Risks, benefits and expectations were discussed with the patient. Patient understand the risks, benefits and expectations and wishes to proceed with surgery.   PCP:  No PCP Per Patient  D/C Plans: Home  PMH: Past Medical History:  Diagnosis Date  . Arthritis   . History of blood transfusion 1983    PSH: Past Surgical History:  Procedure Laterality Date  . 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 arthroscopy of knee Bilateral    2 x each knee  . bunion surgery Right   . EYE SURGERY Right    intraocular lens  . HAMMER TOE SURGERY Right   . HAND SURGERY Right    x 9  . JOINT REPLACEMENT Right 2011 or 2010  . SHOULDER OPEN ROTATOR CUFF REPAIR Right 05/08/2013   Procedure: RIGHT SHOULDER OPEN ROTATOR CUFF REPAIR with ANCHORS;  Surgeon: Tobi Bastos, MD;  Location: [facility];  Service: Orthopedics;  Laterality: Right;  . TONSILLECTOMY  age 59    Social History:  reports that he has never smoked. He has never used smokeless tobacco. He reports that he does not drink alcohol or use drugs.  Allergies:  No Known Allergies  Medications: No current facility-administered medications for this encounter.    Current Outpatient Prescriptions  Medication Sig Dispense Refill  . carboxymethylcellulose (REFRESH PLUS) 0.5 % SOLN Place 1 drop into both eyes 3 (three) times daily as needed (dry eyes).    . cholecalciferol (VITAMIN D) 1000 UNITS tablet Take 2,000 Units by mouth daily.    . fish oil-omega-3 fatty acids 1000 MG capsule  Take 2 g by mouth daily.    Marland Kitchen HYDROcodone-acetaminophen (NORCO/VICODIN) 5-325 MG tablet Take 1-2 tablets by mouth every 4 (four) hours as needed for severe pain. 15 tablet 0  . ibuprofen (ADVIL,MOTRIN) 200 MG tablet Take 400-800 mg by mouth every 6 (six) hours as needed for pain.    . Linoleic Acid Conjugated (CLA PO) Take 1 tablet by mouth daily.    . methocarbamol (ROBAXIN) 500 MG tablet Take 1 tablet (500 mg total) by mouth every 6 (six) hours as needed. 60 tablet 0  . Multiple Vitamin (MULTIVITAMIN WITH MINERALS) TABS tablet Take 1 tablet by mouth daily.    Marland Kitchen OVER THE COUNTER MEDICATION Take 1 tablet by mouth daily. Fruit and vegetable supplement    . oxyCODONE-acetaminophen (PERCOCET/ROXICET) 5-325 MG per tablet Take 1-2 tablets by mouth every 4 (four) hours as needed. 60 tablet 0    No results found for this or any previous visit (from the past 48 hour(s)). No results found.  ROS: Pain with rom of the left upper extremity  Physical Exam:  Alert and oriented 65 y.o. male in no acute distress Cranial nerves 2-12 intact Cervical spine: full rom with no tenderness, nv intact distally Chest: active breath sounds bilaterally, no wheeze rhonchi or rales Heart: regular rate and rhythm, no murmur Abd: non tender non distended with active bowel sounds Hip is stable with rom  Left shoulder with crepitus and pain with rom nv intact distally  No rashes or edema  Assessment/Plan Assessment: left shoulder end stage osteoarthritis  Plan: Patient will undergo a left total shoulder by Dr. Veverly Fells at [facility]. Risks benefits and expectations were discussed with the patient. Patient understand risks, benefits and expectations and wishes to proceed.

## 2016-08-18 LAB — BASIC METABOLIC PANEL
Anion gap: 7 (ref 5–15)
BUN: 13 mg/dL (ref 6–20)
CO2: 24 mmol/L (ref 22–32)
Calcium: 8.2 mg/dL — ABNORMAL LOW (ref 8.9–10.3)
Chloride: 105 mmol/L (ref 101–111)
Creatinine, Ser: 1.16 mg/dL (ref 0.61–1.24)
GFR calc Af Amer: >60 mL/min (ref >60)
GFR calc non Af Amer: >60 mL/min (ref >60)
GLUCOSE: 123 mg/dL — AB (ref 65–99)
POTASSIUM: 3.8 mmol/L (ref 3.5–5.1)
Sodium: 136 mmol/L (ref 135–145)

## 2016-08-18 LAB — HEMOGLOBIN AND HEMATOCRIT, BLOOD
HCT: 36.8 % — ABNORMAL LOW (ref 39.0–52.0)
Hemoglobin: 12.5 g/dL — ABNORMAL LOW (ref 13.0–17.0)

## 2016-08-18 NOTE — Progress Notes (Signed)
Discharge instruction gave to pt and all questions answered. Pt is waiting for a x-large sling to go. The current sling was too small for pt.

## 2016-08-18 NOTE — Op Note (Signed)
NAMENENO, ASKIN NO.:  [id number]  MEDICAL RECORD NO.:  [id number]  LOCATION:  5N29C                        FACILITY:  [facility]  PHYSICIAN:  Doran Heater. Veverly Fells, M.D. DATE OF BIRTH:  11/09/1950  DATE OF PROCEDURE:  08/17/2016 DATE OF DISCHARGE:                              OPERATIVE REPORT   PREOPERATIVE DIAGNOSIS:  Left shoulder end-stage osteoarthritis.  POSTOPERATIVE DIAGNOSIS: 1. Left shoulder end-stage osteoarthritis. 2. Left shoulder rotator cuff insufficiency.  PROCEDURE PERFORMED:  Left shoulder reverse total shoulder arthroplasty using Delta Xtend prosthesis with subscapularis repair.  ATTENDING SURGEON:  Doran Heater. Veverly Fells, M.D.  ASSISTANT:  Abbott Pao. Dixon, NP, who has scrubbed the majority of the procedure leaving only once implants were in position and the muscular layer was closed.  ANESTHESIA:  General anesthesia was used plus interscalene block.  ESTIMATED BLOOD LOSS:  Minimal.  FLUID REPLACED:  1200 mL crystalloid.  INSTRUMENT COUNTS:  Correct.  COMPLICATIONS:  None.  ANTIBIOTICS:  Perioperative antibiotics were given.  INDICATIONS:  The patient is a 65 year old male with worsening left shoulder pain and dysfunction secondary to end-stage arthritis. Preoperative MRI scan indicating some partial-thickness tearing and thinning of the rotator cuff.  There was not significant muscle atrophy noted.  It was felt that the patient would likely be a candidate for anatomic shoulder arthroplasty to restore smooth bearing surface.  We discussed options for management to include continued conservative management versus arthroplasty.  The patient elected to proceed with shoulder arthroplasty given that he was in unbearable pain and cortisone injections which have initially helped and were no longer helping him. The patient was unable to do his job with the shoulder in the condition it was.  Risks and benefits of surgery were discussed.  Informed  consent obtained.  DESCRIPTION OF PROCEDURE:  After adequate level of anesthesia achieved, the patient was positioned in the modified beach-chair position.  Left shoulder correctly identified and sterilely prepped and draped in usual manner.  Time-out was called.  We verified correct patient, correct site, and initiated shoulder surgery using standard deltopectoral incision.  10 blade scalpel used to make our incision started at the coracoid process extending down to the anterior humerus.  Dissection to the subcutaneous tissues with a needle-tip Bovie, identified the cephalic vein, took it laterally the deltoid, pectoralis taken medially. Conjoint tendon identified and retracted medially.  The subscapularis was inspected and noted to be somewhat thin, but it was intact.  We did release the subscapularis subperiosteally off the lesser tuberosity.  I went ahead and tenodesed the biceps using figure-of-eight 0 Vicryl suture into the pectoralis tendon.  Additionally, we whip-stitched #2 FiberWire suture in the free end of the biceps tendon after we cut it and we are going to use that through drill holes to further affected tenodesis through bone.  At this point, given how thin the subscapularis was, we directed our attention towards the supraspinatus, that tendon was incredibly thin only about 2 mm thick going all the way to the level of the glenoid and due to how atrophic and thin that was in the fact that it was partially avulsed from the greater tuberosity, it just looked in very bad shape.  I at this point, was concerned about the patient's ability to successfully recover from an anatomic shoulder replacement both recovering from a functional standpoint and also whether or not the shoulder replacement actually last if he were not able to balance his posterior rotator cuff with his anterior and superior rotator cuff and that would be present motion that would likely rock his glenoid  loose over time or cause an early failure.  I went ahead and scrubbed out of the case and went out to speak to the patient's wife concerning the operative findings that the subscapularis and the supraspinatus I felt were not in good enough condition to proceed with an anatomic shoulder replacement.  Recommending reverse shoulder replacement with subscapularis repair.  The patient's wife was in agreement with this.  We just did want to do an operation and would likely fail and lead to another operation later.  The patient's family understood and gave his consent.  We went ahead and scrubbed back into surgery.  We then at this point extended the shoulder, released the remaining very atrophic supraspinatus tendon and the infraspinatus tendon back to some good teres minor and posterior infraspinatus, and at this point, I went ahead and entered the proximal humerus with a 6 mm reamer, reamed up to a size 14 and then placed our 14 mm intramedullary guide and resected the humerus for the DePuy Delta Xtend prosthesis set on 10 degrees of retroversion.  Once we had done that, we removed excess osteophytes with a rongeur all the way to the posterior position.  We then went ahead and milled for the metaphyseal component of the Delta Xtend prosthesis.  We selected an epi 2 left and then milled that to the appropriate depth.  Once we had the humerus prepared, we then went ahead and impacted the trial with a 14 body with an epi 2 left set on the 0 setting and placed in 10 degrees retroversion.  With that in place, we subluxed the humerus posteriorly.  We did a 360-degree capsular removal. Severe degenerative changes were noted in the shoulder joint with significant retroversion and large spur extending posteriorly we removed that with an osteotome.  We fully exposed the shoulder removing the glenoid labrum, removing multiple loose bodies, and then at this point, we found the center point and __________  on the inferior aspect of the glenoid and drilled a guide pin.  We then reamed for the metaglene and then drilled our central peg hole and impacted the metaglene in position.  We had good bony support.  We were exactly at the inferior margin of the glenoid itself.  At this point, we used our peripheral hand reamer to make sure we had room to see the glenoid sphere, we then went ahead and placed our screws with 30 inferiorly and 30 proximally into the base of the coracoid and then a 24 locked anteriorly and 18 nonlocked posteriorly, so 3 lock screws, excellent bony support and security, and then we selected a 42 standard glenoid sphere, and slid that over Nitinol wire into the metaglene.  We were careful to protect the axillary nerve to make sure that was free and clear including after we had seated the glenoid sphere.  With that in place, we reduced the shoulder with a 42+ 3 poly and we had a nice tight fit with a good tight conjoint, no gapping with distal pull or external rotation.  We removed the trial components on the humeral side, drilled holes and  placed #2 FiberWire for repairing the subscapularis anatomically and also the biceps tendon.  We tenodesed the biceps.  We then used impaction grafting after thorough irrigation of the humerus.  We used impaction grafting technique to impact the HA coated 14 stem with the epi 2 left on the 0 setting and placed in 10 degrees retroversion.  Once that was impacted into place, we selected real 42+ 3 poly, impacted that, reduced the shoulder with a nice little pop and checked to make sure that the axillary nerve was free and clear which it was.  It was under some tension but did not see any excessive.  We then went ahead and very carefully repaired the subscapularis back to the lesser tuberosity that had been prepared, we used a rongeur to freshen up the bone there and then sewed the tendon directly to bone.  We could externally rotate 35- 40  degrees after our repair.  We could fully elevate.  There was no tethering at all of the shoulder, so we had most of infraspinatus and teres minor left as well as the subscapularis and this should hopefully give a significant functional improvement, so at this point, we went ahead and concluded the surgery and then went ahead and repaired the deltopectoral interval with 0 Vicryl suture followed by 2-0 Vicryl for subcutaneous closure and 4-0 Monocryl for skin.  Steri-Strips applied followed by sterile dressing.  The patient tolerated the surgery well.     Doran Heater. Veverly Fells, M.D.     SRN/MEDQ  D:  08/17/2016  T:  08/18/2016  Job:  [id number]

## 2016-08-18 NOTE — Progress Notes (Signed)
Orthopedics Progress Note  Subjective: Pain better controlled this morning. C/o left thumb numbness  Objective:  Vitals:   08/17/16 2353 08/18/16 0446  BP: 123/67 (!) 106/57  Pulse: 82 85  Resp:    Temp: 98.3 F (36.8 C) 98.5 F (36.9 C)    General: Awake and alert  Musculoskeletal: Left shoulder dressing CDI, NVI including ax nerve, except some thumb numbness, 5/5 EPL though Neurovascularly intact  Lab Results  Component Value Date   WBC 4.4 08/17/2016   HGB 12.5 (L) 08/18/2016   HCT 36.8 (L) 08/18/2016   MCV 80.0 08/17/2016   PLT 217 08/17/2016       Component Value Date/Time   NA 136 08/18/2016 0424   K 3.8 08/18/2016 0424   CL 105 08/18/2016 0424   CO2 24 08/18/2016 0424   GLUCOSE 123 (H) 08/18/2016 0424   BUN 13 08/18/2016 0424   CREATININE 1.16 08/18/2016 0424   CALCIUM 8.2 (L) 08/18/2016 0424   GFRNONAA >60 08/18/2016 0424   GFRAA >60 08/18/2016 0424    Lab Results  Component Value Date   INR 0.95 04/23/2013   INR 2.6 (H) 05/06/2009   INR 2.1 (H) 05/05/2009    Assessment/Plan: POD #1 s/p Procedure(s): left reverse shoulder arthroplasty OT, then d/c home Follow up in two weeks  Doran Heater. Veverly Fells, MD 08/18/2016 9:03 AM

## 2016-08-18 NOTE — Discharge Summary (Signed)
Physician Discharge Summary   Patient ID: Anthony Kane MRN: [id number] DOB/AGE: 04/14/1951 65 y.o.  Admit date: 08/17/2016 Discharge date: 08/18/2016  Admission Diagnoses:  Active Problems:   S/P shoulder replacement, left   Discharge Diagnoses:  Same   Surgeries: Procedure(s): left reverse shoulder arthroplasty on 08/17/2016   Consultants: OT  Discharged Condition: Stable  Hospital Course: Anthony Kane is an 65 y.o. male who was admitted 08/17/2016 with a chief complaint of left shoulder pain, and found to have a diagnosis of left shoulder end stage OA and rotator cuff insufficency.  They were brought to the operating room on 08/17/2016 and underwent the above named procedures.    The patient had an uncomplicated hospital course and was stable for discharge.  Recent vital signs:  Vitals:   08/17/16 2353 08/18/16 0446  BP: 123/67 (!) 106/57  Pulse: 82 85  Resp:    Temp: 98.3 F (36.8 C) 98.5 F (36.9 C)    Recent laboratory studies:  Results for orders placed or performed during the hospital encounter of 08/17/16  CBC  Result Value Ref Range   WBC 4.4 4.0 - 10.5 K/uL   RBC 5.60 4.22 - 5.81 MIL/uL   Hemoglobin 15.9 13.0 - 17.0 g/dL   HCT 44.8 39.0 - 52.0 %   MCV 80.0 78.0 - 100.0 fL   MCH 28.4 26.0 - 34.0 pg   MCHC 35.5 30.0 - 36.0 g/dL   RDW 13.2 11.5 - 15.5 %   Platelets 217 150 - 400 K/uL  Basic metabolic panel  Result Value Ref Range   Sodium 139 135 - 145 mmol/L   Potassium 4.3 3.5 - 5.1 mmol/L   Chloride 107 101 - 111 mmol/L   CO2 23 22 - 32 mmol/L   Glucose, Bld 106 (H) 65 - 99 mg/dL   BUN 16 6 - 20 mg/dL   Creatinine, Ser 1.17 0.61 - 1.24 mg/dL   Calcium 9.3 8.9 - 10.3 mg/dL   GFR calc non Af Amer >60 >60 mL/min   GFR calc Af Amer >60 >60 mL/min   Anion gap 9 5 - 15  Hemoglobin and hematocrit, blood  Result Value Ref Range   Hemoglobin 12.5 (L) 13.0 - 17.0 g/dL   HCT 36.8 (L) 39.0 - [id number] %  Basic metabolic panel  Result Value Ref  Range   Sodium 136 135 - 145 mmol/L   Potassium 3.8 3.5 - 5.1 mmol/L   Chloride 105 101 - 111 mmol/L   CO2 24 22 - 32 mmol/L   Glucose, Bld 123 (H) 65 - 99 mg/dL   BUN 13 6 - 20 mg/dL   Creatinine, Ser 1.16 0.61 - 1.24 mg/dL   Calcium 8.2 (L) 8.9 - 10.3 mg/dL   GFR calc non Af Amer >60 >60 mL/min   GFR calc Af Amer >60 >60 mL/min   Anion gap 7 5 - 15    Discharge Medications:   Allergies as of 08/18/2016      Reactions   No Known Allergies       Medication List    STOP taking these medications   naproxen sodium 220 MG tablet Commonly known as:  ANAPROX     TAKE these medications   ibuprofen 200 MG tablet Commonly known as:  ADVIL,MOTRIN Take 400 mg by mouth 2 (two) times daily as needed for headache or moderate pain.   methocarbamol 500 MG tablet Commonly known as:  ROBAXIN Take 1 tablet (500 mg total) by mouth 3 (three)  times daily as needed.   oxyCODONE-acetaminophen 5-325 MG tablet Commonly known as:  ROXICET Take 1-2 tablets by mouth every 4 (four) hours as needed for severe pain.       Diagnostic Studies: Dg Shoulder Left Port  Result Date: 08/17/2016 CLINICAL DATA:  Status post left shoulder replacement EXAM: LEFT SHOULDER - 1 VIEW COMPARISON:  Chest x-ray 04/23/2013 FINDINGS: Single portable view of the left shoulder. [facility] joint is intact. Patient is status post left reverse shoulder replacement with intact hardware. Glenoid component is slightly inferior in position. Moderate gas in the joint space is felt postoperative. Linear atelectasis at the left base. IMPRESSION: Status post left shoulder replacement with postoperative soft tissue gas present. Electronically Signed   By: Donavan Foil M.D.   On: 08/17/2016 16:31    Disposition: 01-Home or Self Care    Follow-up Information    Rafeef Lau,STEVEN R, MD. Call in 2 weeks.   Specialty:  Orthopedic Surgery Why:  [phone] Contact information: [address] [place]  [address] [phone]            Signed: Augustin Schooling 08/18/2016, 9:06 AM

## 2016-08-18 NOTE — Evaluation (Signed)
Occupational Therapy Evaluation and Discharge Patient Details Name: Anthony Kane MRN: [id number] DOB: 05/19/1951 Today's Date: 08/18/2016    History of Present Illness Pt is a 65 y/o male s/p left total shoulder replacement with conservative protocol. Pt has a past medical history of Arthritis and History of blood transfusion (1983). 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   Clinical Impression   PTA Pt independent in ADL and mobility. 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 Pt currently mod assist for ADL and supervision for mobility (due to nausea). Pt educated on precautions, and OT shoulder handout reviewed in full. Pt will have good support in teenage son and wife when he discharges home. Pt with no questions or concerns, and practiced all exercises as stated below. OT stressed that it is important not to push and pull on shoulder in order to give it time to repair, and to stick to protocol. Pt in agreement. No further OT needs at this time, please progress shoulder as ordered by MD at follow up. Thank you for this referral.    Follow Up Recommendations  Other (comment) (Progress rehab of shoulder as ordered by MD)    Equipment Recommendations  None recommended by OT    Recommendations for Other Services       Precautions / Restrictions Precautions Precautions: Shoulder Type of Shoulder Precautions: Conservative Protocol Shoulder Interventions: Shoulder sling/immobilizer;At all times;Off for dressing/bathing/exercises Precaution Booklet Issued: Yes (comment) Precaution Comments: OT shoulder handout reviewed in full Required Braces or Orthoses:  Sling Restrictions Weight Bearing Restrictions: Yes LUE Weight Bearing: Non weight bearing Other Position/Activity Restrictions: Gentle pendulums and lap slides, hand and wrist ROM, hand to face ok      Mobility Bed Mobility Overal bed mobility: Modified Independent             General bed mobility comments: increased time  Transfers Overall transfer level: Modified independent Equipment used:  (sling)             General transfer comment: no attempt to use LUE to push up    Balance Overall balance assessment: No apparent balance deficits (not formally assessed)                                          ADL Overall ADL's : Needs assistance/impaired Eating/Feeding: Minimal assistance;Sitting Eating/Feeding Details (indicate cue type and reason): for cutting or other bilateral activities to maintain NWB Grooming: Wash/dry hands;Wash/dry face;Standing Grooming Details (indicate cue type and reason): sink level ADL Upper Body Bathing: Moderate assistance;Standing Upper Body Bathing Details (indicate cue type and reason): Ptr educated in how to wash under arm Lower Body Bathing: Min guard (standing)   Upper Body Dressing : With caregiver independent assisting;Maximal assistance;Sitting Upper Body Dressing Details (indicate cue type and reason): educated in safety and sequence Lower Body Dressing: Minimal assistance;With caregiver independent assisting;Sit to/from stand   Toilet Transfer: Supervision/safety;Ambulation;Comfort height toilet   Toileting- Clothing Manipulation and Hygiene: Supervision/safety;Sit to/from stand   Tub/ Shower Transfer: Tub transfer;Supervision/safety;Ambulation   Functional mobility during ADLs: Supervision/safety General ADL Comments: stressed the importance of no movement of shoulder during ADL  Vision     Perception     Praxis      Pertinent Vitals/Pain Pain Assessment: 0-10 Pain Score: 3  Pain Location:  shoulder and IV site Pain Descriptors / Indicators: Sore     Hand Dominance Left   Extremity/Trunk Assessment Upper Extremity Assessment Upper Extremity Assessment: LUE deficits/detail LUE Deficits / Details: s/p deficits in strength and ROM typical LUE: Unable to fully assess due to immobilization LUE Sensation: decreased light touch (block still wearing off)   Lower Extremity Assessment Lower Extremity Assessment: Overall WFL for tasks assessed   Cervical / Trunk Assessment Cervical / Trunk Assessment: Normal   Communication Communication Communication: No difficulties   Cognition Arousal/Alertness: Awake/alert Behavior During Therapy: WFL for tasks assessed/performed Overall Cognitive Status: Within Functional Limits for tasks assessed                     General Comments       Exercises Exercises: Shoulder     Shoulder Instructions Shoulder Instructions Donning/doffing shirt without moving shoulder: Moderate assistance;Patient able to independently direct caregiver Method for sponge bathing under operated UE: Supervision/safety Donning/doffing sling/immobilizer: Maximal assistance;Patient able to independently direct caregiver Correct positioning of sling/immobilizer: Modified independent Pendulum exercises (written home exercise program): Modified independent ROM for elbow, wrist and digits of operated UE: Supervision/safety Sling wearing schedule (on at all times/off for ADL's): Modified independent Proper positioning of operated UE when showering: Minimal assistance;Patient able to independently direct caregiver Positioning of UE while sleeping: Modified independent    Home Living Family/patient expects to be discharged to:: Private residence Living Arrangements: Spouse/significant other Available Help at Discharge: Family;Available 24 hours/day Type of Home: House Home Access: Stairs to enter [employer] of Steps: 3   Home Layout:  Multi-level Alternate Level Stairs-Number of Steps: flight Alternate Level Stairs-Rails: Left Bathroom Shower/Tub: Tub/shower unit Shower/tub characteristics: Architectural technologist: Standard Bathroom Accessibility: Yes   Home Equipment: None          Prior Functioning/Environment Level of Independence: Independent        Comments: very active truck driver        OT Problem List: Decreased strength;Decreased range of motion;Decreased activity tolerance;Impaired balance (sitting and/or standing);Impaired UE functional use;Decreased knowledge of precautions   OT Treatment/Interventions:      OT Goals(Current goals can be found in the care plan section) Acute Rehab OT Goals Patient Stated Goal: to get home OT Goal Formulation: With patient Time For Goal Achievement: 08/25/16 Potential to Achieve Goals: Good  OT Frequency:     Barriers to D/C:            Co-evaluation              End of Session Equipment Utilized During Treatment: Other (comment) (sling) Nurse Communication: Mobility status;Other (comment) (needs new sling (ortho tech text paged))  Activity Tolerance: Patient tolerated treatment well Patient left: in chair;with call bell/phone within reach   Time: [id number] OT Time Calculation (min): 30 min Charges:  OT General Charges $OT Visit: 1 Procedure OT Evaluation $OT Eval Moderate Complexity: 1 Procedure OT Treatments $Self Care/Home Management : 8-22 mins G-Codes:    Merri Ray Rawan Riendeau 09-17-16, 2:10 PM  Hulda Humphrey OTR/L [phone]

## 2016-08-20 MED FILL — Thrombin For Soln 5000 Unit: CUTANEOUS | Qty: 5000 | Status: AC

## 2016-08-21 ENCOUNTER — Encounter (HOSPITAL_COMMUNITY): Payer: Self-pay | Admitting: Orthopedic Surgery

## 2016-08-23 ENCOUNTER — Encounter (HOSPITAL_COMMUNITY): Payer: Self-pay | Admitting: Orthopedic Surgery

## 2016-08-23 NOTE — OR Nursing (Signed)
Late entry for charge/implant error.

## 2017-09-03 HISTORY — PX: COLONOSCOPY: SHX174

## 2017-10-08 ENCOUNTER — Encounter: Payer: Self-pay | Admitting: Physician Assistant

## 2018-02-11 ENCOUNTER — Encounter: Payer: Self-pay | Admitting: Internal Medicine

## 2018-04-04 ENCOUNTER — Ambulatory Visit (AMBULATORY_SURGERY_CENTER): Payer: Self-pay

## 2018-04-04 VITALS — Ht 72.0 in | Wt 234.0 lb

## 2018-04-04 DIAGNOSIS — Z1211 Encounter for screening for malignant neoplasm of colon: Secondary | ICD-10-CM

## 2018-04-04 MED ORDER — NA SULFATE-K SULFATE-MG SULF 17.5-3.13-1.6 GM/177ML PO SOLN: 1.0000 | Freq: Once | ORAL | 0 refills | Status: AC

## 2018-04-04 NOTE — Progress Notes (Signed)
Denies allergies to eggs or soy products. Denies complication of anesthesia or sedation. Denies use of weight loss medication. Denies use of O2.   Emmi instructions declined.  A coupon for 50.00 off Suprep was given to patient.

## 2018-04-15 ENCOUNTER — Encounter: Payer: Self-pay | Admitting: Internal Medicine

## 2018-04-18 ENCOUNTER — Ambulatory Visit (AMBULATORY_SURGERY_CENTER): Payer: BLUE CROSS/BLUE SHIELD | Admitting: Internal Medicine

## 2018-04-18 ENCOUNTER — Encounter: Payer: Self-pay | Admitting: Internal Medicine

## 2018-04-18 VITALS — BP 118/80 | HR 74 | Temp 99.1°F | Resp 13 | Ht 72.0 in | Wt 234.0 lb

## 2018-04-18 DIAGNOSIS — D122 Benign neoplasm of ascending colon: Secondary | ICD-10-CM

## 2018-04-18 DIAGNOSIS — Z1211 Encounter for screening for malignant neoplasm of colon: Secondary | ICD-10-CM

## 2018-04-18 MED ORDER — SODIUM CHLORIDE 0.9 % IV SOLN: 500.0000 mL | Freq: Once | INTRAVENOUS | Status: DC

## 2018-04-18 NOTE — Op Note (Signed)
[facility] Patient Name: Anthony Kane Procedure Date: 04/18/2018 1:38 PM MRN: [id number] Endoscopist: Docia Chuck. Henrene Pastor , MD Age: 67 Referring MD:  Date of Birth: 1951-02-14 Gender: Male Account #: [id number] Procedure:                Colonoscopy, with cold snare polypectomy x 1 Indications:              Screening for colorectal malignant neoplasm.                            Reports negative examinations 10+ years ago in                            [place]:                Monitored Anesthesia Care Procedure:                Pre-Anesthesia Assessment:                           - Prior to the procedure, a History and Physical                            was performed, and patient medications and                            allergies were reviewed. The patient's tolerance of                            previous anesthesia was also reviewed. The risks                            and benefits of the procedure and the sedation                            options and risks were discussed with the patient.                            All questions were answered, and informed consent                            was obtained. Prior Anticoagulants: The patient has                            taken no previous anticoagulant or antiplatelet                            agents. ASA Grade Assessment: II - A patient with                            mild systemic disease. After reviewing the risks                            and benefits, the patient was deemed in  satisfactory condition to undergo the procedure.                           After obtaining informed consent, the colonoscope                            was passed under direct vision. Throughout the                            procedure, the patient's blood pressure, pulse, and                            oxygen saturations were monitored continuously. The                            Colonoscope was introduced  through the anus and                            advanced to the the cecum, identified by                            appendiceal orifice and ileocecal valve. The                            ileocecal valve, appendiceal orifice, and rectum                            were photographed. The quality of the bowel                            preparation was excellent. The colonoscopy was                            performed without difficulty. The patient tolerated                            the procedure well. The bowel preparation used was                            SUPREP. Scope In: 1:45:30 PM Scope Out: 2:01:08 PM Scope Withdrawal Time: 0 hours 11 minutes 37 seconds  Total Procedure Duration: 0 hours 15 minutes 38 seconds  Findings:                 A 5 mm polyp was found in the proximal ascending                            colon. The polyp was sessile. The polyp was removed                            with a cold snare. Resection and retrieval were                            complete.  The exam was otherwise without abnormality on                            direct and retroflexion views. Complications:            No immediate complications. Estimated blood loss:                            None. Estimated Blood Loss:     Estimated blood loss: none. Impression:               - One 5 mm polyp in the proximal ascending colon,                            removed with a cold snare. Resected and retrieved.                           - The examination was otherwise normal on direct                            and retroflexion views. Recommendation:           - Repeat colonoscopy in 5 years if polyp                            adenomatous or SSP. Otherwise, 10 years.                           - Patient has a contact number available for                            emergencies. The signs and symptoms of potential                            delayed complications were discussed with  the                            patient. Return to normal activities tomorrow.                            Written discharge instructions were provided to the                            patient.                           - Resume previous diet.                           - Continue present medications.                           - Await pathology results. Docia Chuck. Henrene Pastor, MD 04/18/2018 2:05:01 PM This report has been signed electronically.

## 2018-04-18 NOTE — Progress Notes (Signed)
Called to room to assist during endoscopic procedure.  Patient ID and intended procedure confirmed with present staff. Received instructions for my participation in the procedure from the performing physician.  

## 2018-04-18 NOTE — Progress Notes (Signed)
Pt's states no medical or surgical changes since previsit or office visit. 

## 2018-04-18 NOTE — Progress Notes (Signed)
To PACU, VSS. Report to [email or web address] 

## 2018-04-18 NOTE — Patient Instructions (Signed)
Discharge instructions given. Handout on polyps. Resume previous medications. YOU HAD AN ENDOSCOPIC PROCEDURE TODAY AT THE [facility]:   Refer to the procedure report that was given to you for any specific questions about what was found during the examination.  If the procedure report does not answer your questions, please call your gastroenterologist to clarify.  If you requested that your care partner not be given the details of your procedure findings, then the procedure report has been included in a sealed envelope for you to review at your convenience later.  YOU SHOULD EXPECT: Some feelings of bloating in the abdomen. Passage of more gas than usual.  Walking can help get rid of the air that was put into your GI tract during the procedure and reduce the bloating. If you had a lower endoscopy (such as a colonoscopy or flexible sigmoidoscopy) you may notice spotting of blood in your stool or on the toilet paper. If you underwent a bowel prep for your procedure, you may not have a normal bowel movement for a few days.  Please Note:  You might notice some irritation and congestion in your nose or some drainage.  This is from the oxygen used during your procedure.  There is no need for concern and it should clear up in a day or so.  SYMPTOMS TO REPORT IMMEDIATELY:   Following lower endoscopy (colonoscopy or flexible sigmoidoscopy):  Excessive amounts of blood in the stool  Significant tenderness or worsening of abdominal pains  Swelling of the abdomen that is new, acute  Fever of 100F or higher   For urgent or emergent issues, a gastroenterologist can be reached at any hour by calling [phone].   DIET:  We do recommend a small meal at first, but then you may proceed to your regular diet.  Drink plenty of fluids but you should avoid alcoholic beverages for 24 hours.  ACTIVITY:  You should plan to take it easy for the rest of today and you should NOT DRIVE or use heavy  machinery until tomorrow (because of the sedation medicines used during the test).    FOLLOW UP: Our staff will call the number listed on your records the next business day following your procedure to check on you and address any questions or concerns that you may have regarding the information given to you following your procedure. If we do not reach you, we will leave a message.  However, if you are feeling well and you are not experiencing any problems, there is no need to return our call.  We will assume that you have returned to your regular daily activities without incident.  If any biopsies were taken you will be contacted by phone or by letter within the next 1-3 weeks.  Please call us at [phone] if you have not heard about the biopsies in 3 weeks.    SIGNATURES/CONFIDENTIALITY: You and/or your care partner have signed paperwork which will be entered into your electronic medical record.  These signatures attest to the fact that that the information above on your After Visit Summary has been reviewed and is understood.  Full responsibility of the confidentiality of this discharge information lies with you and/or your care-partner. 

## 2018-04-21 ENCOUNTER — Telehealth: Payer: Self-pay | Admitting: *Deleted

## 2018-04-21 ENCOUNTER — Telehealth: Payer: Self-pay

## 2018-04-21 NOTE — Telephone Encounter (Signed)
Left message

## 2018-04-21 NOTE — Telephone Encounter (Signed)
  Follow up Call-  Call back number 04/18/2018  Post procedure Call Back phone  # [phone]  Permission to leave phone message Yes  Some recent data might be hidden     Patient questions:  Do you have a fever, pain , or abdominal swelling? No. Pain Score  0 *  Have you tolerated food without any problems? Yes.    Have you been able to return to your normal activities? Yes.    Do you have any questions about your discharge instructions: Diet   No. Medications  No. Follow up visit  No.  Do you have questions or concerns about your Care? Yes.    Actions: * If pain score is 4 or above: No action needed, pain <4.

## 2018-04-25 ENCOUNTER — Encounter: Payer: Self-pay | Admitting: Internal Medicine

## 2019-04-30 ENCOUNTER — Other Ambulatory Visit: Payer: Self-pay | Admitting: Orthopaedic Surgery

## 2019-05-20 NOTE — Patient Instructions (Addendum)
DUE TO COVID-19 ONLY ONE VISITOR IS ALLOWED TO COME WITH YOU AND STAY IN THE WAITING ROOM ONLY DURING PRE OP AND PROCEDURE DAY OF SURGERY. THE 1 VISITOR MAY VISIT WITH YOU AFTER SURGERY IN YOUR PRIVATE ROOM DURING VISITING HOURS ONLY!  YOU NEED TO HAVE A COVID 19 TEST ON____9-18-2020___ @____2 :00PM___, THIS TEST MUST BE DONE BEFORE SURGERY, COME  TO [facility] , [id number].  ([place]) ONCE YOUR COVID TEST IS COMPLETED, PLEASE BEGIN THE QUARANTINE INSTRUCTIONS AS OUTLINED IN YOUR HANDOUT.                Anthony Kane    Your procedure is scheduled on: 05-26-2019   Report to [place]  Entrance   Report to admitting at 7:30AM     Call this number if you have problems the morning of surgery [place], NO CHEWING GUM [place].   Do not eat food After Midnight. YOU MAY HAVE CLEAR LIQUIDS FROM MIDNIGHT UNTIL 7:00AM. At 7:00AM Please finish the prescribed Pre-Surgery Gatorade drink. Nothing by mouth after you finish the Gatorade drink !   CLEAR LIQUID DIET   Foods Allowed                                                                     Foods Excluded  Coffee and tea, regular and decaf                             liquids that you cannot  Plain Jell-O any favor except red or purple                                           see through such as: Fruit ices (not with fruit pulp)                                     milk, soups, orange juice  Iced Popsicles                                    All solid food Carbonated beverages, regular and diet                                    Cranberry, grape and apple juices Sports drinks like Gatorade Lightly seasoned clear broth or consume(fat free) Sugar, honey syrup  Sample Menu Breakfast                                Lunch                                     Supper  Cranberry juice                    Beef broth                             Chicken broth Jell-O                                     Grape juice                           Apple juice Coffee or tea                        Jell-O                                      Popsicle                                                Coffee or tea                        Coffee or tea  _____________________________________________________________________    Take these medicines the morning of surgery with A SIP OF WATER: none                                 You may not have any metal on your body including hair pins and              piercings  Do not wear jewelry, make-up, lotions, powders or perfumes, deodorant                          Men may shave face and neck.   Do not bring valuables to the hospital. [place].  Contacts, dentures or bridgework may not be worn into surgery.  YOU MAY BRING A SMALL OVERNIGHT BAG              Please read over the following fact sheets you were given: _____________________________________________________________________             [facility] - Preparing for Surgery Before surgery, you can play an important role.  Because skin is not sterile, your skin needs to be as free of germs as possible.  You can reduce the number of germs on your skin by washing with CHG (chlorahexidine gluconate) soap before surgery.  CHG is an antiseptic cleaner which kills germs and bonds with the skin to continue killing germs even after washing. Please DO NOT use if you have an allergy to CHG or antibacterial soaps.  If your skin becomes reddened/irritated stop using the CHG and inform your nurse when you arrive at Short Stay. Do not shave (including legs and underarms) for at least 48 hours prior to the first CHG shower.  You may shave your face/neck. Please follow these instructions carefully:  1.  Shower with CHG Soap the night before surgery and the  morning of Surgery.  2.  If you choose to  wash your hair, wash your hair first as usual with your  normal  shampoo.  3.  After you shampoo, rinse your hair and body thoroughly to remove the  shampoo.                           4.  Use CHG as you would any other liquid soap.  You can apply chg directly  to the skin and wash                       Gently with a scrungie or clean washcloth.  5.  Apply the CHG Soap to your body ONLY FROM THE NECK DOWN.   Do not use on face/ open                           Wound or open sores. Avoid contact with eyes, ears mouth and genitals (private parts).                       Wash face,  Genitals (private parts) with your normal soap.             6.  Wash thoroughly, paying special attention to the area where your surgery  will be performed.  7.  Thoroughly rinse your body with warm water from the neck down.  8.  DO NOT shower/wash with your normal soap after using and rinsing off  the CHG Soap.                9.  Pat yourself dry with a clean towel.            10.  Wear clean pajamas.            11.  Place clean sheets on your bed the night of your first shower and do not  sleep with pets. Day of Surgery : Do not apply any lotions/deodorants the morning of surgery.  Please wear clean clothes to the hospital/surgery center.  FAILURE TO FOLLOW THESE INSTRUCTIONS MAY RESULT IN THE CANCELLATION OF YOUR SURGERY PATIENT SIGNATURE_________________________________  NURSE SIGNATURE__________________________________  ________________________________________________________________________   Anthony Kane  An incentive spirometer is a tool that can help keep your lungs clear and active. This tool measures how well you are filling your lungs with each breath. Taking long deep breaths may help reverse or decrease the chance of developing breathing (pulmonary) problems (especially infection) following:  A long period of time when you are unable to move or be active. BEFORE THE PROCEDURE   If the  spirometer includes an indicator to show your best effort, your nurse or respiratory therapist will set it to a desired goal.  If possible, sit up straight or lean slightly forward. Try not to slouch.  Hold the incentive spirometer in an upright position. INSTRUCTIONS FOR USE  1. Sit on the edge of your bed if possible, or sit up as far as you can in bed or on a chair. 2. Hold the incentive spirometer in an upright position. 3. Breathe out normally. 4. Place the mouthpiece in your mouth and seal your lips tightly around it. 5. Breathe in slowly and as deeply as possible, raising the piston or the ball toward the top of the  column. 6. Hold your breath for 3-5 seconds or for as long as possible. Allow the piston or ball to fall to the bottom of the column. 7. Remove the mouthpiece from your mouth and breathe out normally. 8. Rest for a few seconds and repeat Steps 1 through 7 at least 10 times every 1-2 hours when you are awake. Take your time and take a few normal breaths between deep breaths. 9. The spirometer may include an indicator to show your best effort. Use the indicator as a goal to work toward during each repetition. 10. After each set of 10 deep breaths, practice coughing to be sure your lungs are clear. If you have an incision (the cut made at the time of surgery), support your incision when coughing by placing a pillow or rolled up towels firmly against it. Once you are able to get out of bed, walk around indoors and cough well. You may stop using the incentive spirometer when instructed by your caregiver.  RISKS AND COMPLICATIONS  Take your time so you do not get dizzy or light-headed.  If you are in pain, you may need to take or ask for pain medication before doing incentive spirometry. It is harder to take a deep breath if you are having pain. AFTER USE  Rest and breathe slowly and easily.  It can be helpful to keep track of a log of your progress. Your caregiver can provide  you with a simple table to help with this. If you are using the spirometer at home, follow these instructions: [place] IF:   You are having difficultly using the spirometer.  You have trouble using the spirometer as often as instructed.  Your pain medication is not giving enough relief while using the spirometer.  You develop fever of 100.5 F (38.1 C) or higher. SEEK IMMEDIATE MEDICAL CARE IF:   You cough up bloody sputum that had not been present before.  You develop fever of 102 F (38.9 C) or greater.  You develop worsening pain at or near the incision site. MAKE SURE YOU:   Understand these instructions.  Will watch your condition.  Will get help right away if you are not doing well or get worse. Document Released: 12/31/2006 Document Revised: 11/12/2011 Document Reviewed: 03/03/2007 [facility] Patient Information 2014 [place], [place].   ________________________________________________________________________

## 2019-05-21 ENCOUNTER — Ambulatory Visit (HOSPITAL_COMMUNITY)
Admission: RE | Admit: 2019-05-21 | Discharge: 2019-05-21 | Disposition: A | Discharge status: Home / Self Care | Payer: BC Managed Care – PPO | Source: Ambulatory Visit | Attending: Orthopaedic Surgery | Admitting: Orthopaedic Surgery

## 2019-05-21 ENCOUNTER — Encounter (HOSPITAL_COMMUNITY): Payer: Self-pay

## 2019-05-21 ENCOUNTER — Other Ambulatory Visit: Payer: Self-pay

## 2019-05-21 ENCOUNTER — Encounter (HOSPITAL_COMMUNITY)
Admission: RE | Admit: 2019-05-21 | Discharge: 2019-05-21 | Disposition: A | Discharge status: Home / Self Care | Payer: BC Managed Care – PPO | Source: Ambulatory Visit | Attending: Orthopaedic Surgery | Admitting: Orthopaedic Surgery

## 2019-05-21 DIAGNOSIS — Z79899 Other long term (current) drug therapy: Secondary | ICD-10-CM | POA: Diagnosis not present

## 2019-05-21 DIAGNOSIS — Z01818 Encounter for other preprocedural examination: Secondary | ICD-10-CM | POA: Insufficient documentation

## 2019-05-21 DIAGNOSIS — M1712 Unilateral primary osteoarthritis, left knee: Secondary | ICD-10-CM | POA: Insufficient documentation

## 2019-05-21 DIAGNOSIS — R7303 Prediabetes: Secondary | ICD-10-CM | POA: Diagnosis not present

## 2019-05-21 HISTORY — DX: Prediabetes: R73.03

## 2019-05-21 LAB — CBC WITH DIFFERENTIAL/PLATELET
Abs Immature Granulocytes: 0.01 10*3/uL (ref 0.00–0.07)
Basophils Absolute: 0 10*3/uL (ref 0.0–0.1)
Basophils Relative: 1 %
Eosinophils Absolute: 0.4 10*3/uL (ref 0.0–0.5)
Eosinophils Relative: 10 %
HCT: 42.8 % (ref 39.0–52.0)
Hemoglobin: 13.7 g/dL (ref 13.0–17.0)
Immature Granulocytes: 0 %
Lymphocytes Relative: 30 %
Lymphs Abs: 1.3 10*3/uL (ref 0.7–4.0)
MCH: 26 pg (ref 26.0–34.0)
MCHC: 32 g/dL (ref 30.0–36.0)
MCV: 81.2 fL (ref 80.0–100.0)
Monocytes Absolute: 0.5 10*3/uL (ref 0.1–1.0)
Monocytes Relative: 11 %
Neutro Abs: 2.1 10*3/uL (ref 1.7–7.7)
Neutrophils Relative %: 48 %
Platelets: 267 10*3/uL (ref 150–400)
RBC: 5.27 MIL/uL (ref 4.22–5.81)
RDW: 14.3 % (ref 11.5–15.5)
WBC: 4.4 10*3/uL (ref 4.0–10.5)
nRBC: 0 % (ref 0.0–0.2)

## 2019-05-21 LAB — BASIC METABOLIC PANEL
Anion gap: 10 (ref 5–15)
BUN: 21 mg/dL (ref 8–23)
CO2: 25 mmol/L (ref 22–32)
Calcium: 9.1 mg/dL (ref 8.9–10.3)
Chloride: 104 mmol/L (ref 98–111)
Creatinine, Ser: 1.04 mg/dL (ref 0.61–1.24)
GFR calc Af Amer: >60 mL/min (ref >60)
GFR calc non Af Amer: >60 mL/min (ref >60)
Glucose, Bld: 102 mg/dL — ABNORMAL HIGH (ref 70–99)
Potassium: 4.8 mmol/L (ref 3.5–5.1)
Sodium: 139 mmol/L (ref 135–145)

## 2019-05-21 LAB — URINALYSIS, ROUTINE W REFLEX MICROSCOPIC
Bilirubin Urine: NEGATIVE
Glucose, UA: NEGATIVE mg/dL
Hgb urine dipstick: NEGATIVE
Ketones, ur: NEGATIVE mg/dL
Leukocytes,Ua: NEGATIVE
Nitrite: NEGATIVE
Protein, ur: NEGATIVE mg/dL
Specific Gravity, Urine: 1.01 (ref 1.005–1.030)
pH: 6 (ref 5.0–8.0)

## 2019-05-21 LAB — APTT: aPTT: 33 seconds (ref 24–36)

## 2019-05-21 LAB — HEMOGLOBIN A1C
Hgb A1c MFr Bld: 5.8 % — ABNORMAL HIGH (ref 4.8–5.6)
Mean Plasma Glucose: 119.76 mg/dL

## 2019-05-21 LAB — PROTIME-INR
INR: 1 (ref 0.8–1.2)
Prothrombin Time: 13.1 seconds (ref 11.4–15.2)

## 2019-05-21 LAB — SURGICAL PCR SCREEN
MRSA, PCR: NEGATIVE
Staphylococcus aureus: POSITIVE — AB

## 2019-05-21 NOTE — Progress Notes (Signed)
PCP - dr Chipper Oman zapata  Cardiologist -   Chest x-ray - done today  EKG - done today  Stress Test -  ECHO -  Cardiac Cath -   Sleep Study -  CPAP -   Fasting Blood Sugar -  Checks Blood Sugar _____ times a day  Blood Thinner Instructions: Aspirin Instructions: Last Dose:  Anesthesia review:    Patient denies shortness of breath, fever, cough and chest pain at PAT appointment   Patient verbalized understanding of instructions that were given to them at the PAT appointment. Patient was also instructed that they will need to review over the PAT instructions again at home before surgery.

## 2019-05-22 ENCOUNTER — Other Ambulatory Visit (HOSPITAL_COMMUNITY)
Admission: RE | Admit: 2019-05-22 | Discharge: 2019-05-22 | Disposition: A | Discharge status: Home / Self Care | Payer: BC Managed Care – PPO | Source: Ambulatory Visit | Attending: Orthopaedic Surgery | Admitting: Orthopaedic Surgery

## 2019-05-22 DIAGNOSIS — Z01812 Encounter for preprocedural laboratory examination: Secondary | ICD-10-CM | POA: Insufficient documentation

## 2019-05-22 DIAGNOSIS — M1712 Unilateral primary osteoarthritis, left knee: Secondary | ICD-10-CM | POA: Diagnosis not present

## 2019-05-22 DIAGNOSIS — Z20828 Contact with and (suspected) exposure to other viral communicable diseases: Secondary | ICD-10-CM | POA: Diagnosis not present

## 2019-05-22 NOTE — Progress Notes (Signed)
PCR result routed to Dr. Dalldorf for review. 

## 2019-05-23 LAB — NOVEL CORONAVIRUS, NAA (HOSP ORDER, SEND-OUT TO REF LAB; TAT 18-24 HRS): SARS-CoV-2, NAA: NOT DETECTED

## 2019-05-25 MED ORDER — BUPIVACAINE LIPOSOME 1.3 % IJ SUSP
20.0000 mL | Freq: Once | INTRAMUSCULAR | Status: DC
  Filled 2019-05-25: qty 20

## 2019-05-25 MED ORDER — TRANEXAMIC ACID 1000 MG/10ML IV SOLN
2000.0000 mg | INTRAVENOUS | Status: DC
  Filled 2019-05-25: qty 20

## 2019-05-25 NOTE — H&P (Signed)
TOTAL KNEE ADMISSION H&P  Patient is being admitted for left total knee arthroplasty.  Subjective:  Chief Complaint:left knee pain.  HPI: Anthony Kane, 68 y.o. male, has a history of pain and functional disability in the left knee due to arthritis and has failed non-surgical conservative treatments for greater than 12 weeks to includeNSAID's and/or analgesics, corticosteriod injections, flexibility and strengthening excercises, supervised PT with diminished ADL's post treatment, use of assistive devices, weight reduction as appropriate and activity modification.  Onset of symptoms was gradual, starting 5 years ago with gradually worsening course since that time. The patient noted no past surgery on the left knee(s).  Patient currently rates pain in the left knee(s) at 10 out of 10 with activity. Patient has night pain, worsening of pain with activity and weight bearing, pain that interferes with activities of daily living, crepitus and joint swelling.  Patient has evidence of subchondral cysts, subchondral sclerosis, periarticular osteophytes and joint space narrowing by imaging studies. There is no active infection.  Patient Active Problem List   Diagnosis Date Noted  . 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 Complete tear of rotator cuff 05/08/2013   Past Medical History:  Diagnosis Date  . Allergy   . Arthritis    Shoulder and thumb  . Cataract   . History of blood transfusion 1983  . Prediabetes    i was but i changed my diet and had a another check and they did say anything about it , denies     Past Surgical History:  Procedure Laterality Date  . 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 arthroscopy of knee Bilateral    2 x each knee  . bunion surgery Right   . COLONOSCOPY    . EYE SURGERY Bilateral    intraocular lens- cataract  . HAMMER TOE SURGERY Right   . HAND SURGERY Right    x 9  . JOINT REPLACEMENT Right 2011 or 2010   knee  . SHOULDER OPEN  ROTATOR CUFF REPAIR Right 05/08/2013   Procedure: RIGHT SHOULDER OPEN ROTATOR CUFF REPAIR with ANCHORS;  Surgeon: Tobi Bastos, MD;  Location: [facility];  Service: Orthopedics;  Laterality: Right;  . TONSILLECTOMY  age 58  . TOTAL SHOULDER ARTHROPLASTY Left 08/17/2016   Procedure: left reverse shoulder arthroplasty;  Surgeon: Netta Cedars, MD;  Location: [facility];  Service: Orthopedics;  Laterality: Left;    Current Facility-Administered Medications  Medication Dose Route Frequency Provider Last Rate Last Dose  . 0.9 %  sodium chloride infusion  500 mL Intravenous Once Irene Shipper, MD      . Derrill Memo ON 05/26/2019] bupivacaine liposome (EXPAREL) 1.3 % injection 266 mg  20 mL Other Once Melrose Nakayama, MD      . Derrill Memo ON 05/26/2019] tranexamic acid (CYKLOKAPRON) 2,000 mg in sodium chloride 0.9 % 50 mL Topical Application  123XX123 mg Topical To OR Melrose Nakayama, MD       Current Outpatient Medications  Medication Sig Dispense Refill Last Dose  . Linoleic Acid-Sunflower Oil (CLA PO) Take 1,000 mg by mouth at bedtime. Conjugated linoleic acid     . meloxicam (MOBIC) 7.5 MG tablet Take 7.5 mg by mouth daily as needed for pain.      . Multiple Vitamin (MULTIVITAMIN WITH MINERALS) TABS tablet Take 3 tablets by mouth at bedtime. Catalyn Chewable by Standard Process     . Multiple Vitamins-Minerals (MEGAVITE FRUITS & VEGGIES PO) Take 4-6 tablets by mouth at bedtime.  Take 2-3 capsules of the Fruit & 2-3 capsules of the Veggie     . naproxen sodium (ALEVE) 220 MG tablet Take 220-440 mg by mouth 2 (two) times daily as needed (pain.).     Marland Kitchen sildenafil (REVATIO) 20 MG tablet Take 20 mg by mouth daily as needed (erectile dysfunction).       Allergies  Allergen Reactions  . No Known Allergies     Social History   Tobacco Use  . Smoking status: Never Smoker  . Smokeless tobacco: Never Used  Substance Use Topics  . Alcohol use: Yes    Comment: rarely maybe 1 time a month    Family History  Problem  Relation Age of Onset  . Esophageal cancer Brother   . Colon cancer Neg Hx   . Rectal cancer Neg Hx   . Stomach cancer Neg Hx      Review of Systems  Musculoskeletal: Positive for joint pain.       Left knee  All other systems reviewed and are negative.   Objective:  Physical Exam  Constitutional: He is oriented to person, place, and time. He appears well-developed and well-nourished.  HENT:  Head: Normocephalic and atraumatic.  Eyes: Pupils are equal, round, and reactive to light.  Neck: Normal range of motion.  Cardiovascular: Normal rate and regular rhythm.  Respiratory: Effort normal.  GI: Soft.  Musculoskeletal:     Comments: Both knees move about 0-120.  On the right he has a well-healed scar.  On the left he has no significant scars with no effusion.  He has medial and lateral joint line pain with crepitation.  Hip motion is full and straight leg raise is negative.  Sensation and motor function are intact in his feet with palpable pulses on both sides.    Neurological: He is alert and oriented to person, place, and time.  Skin: Skin is warm and dry.  Psychiatric: He has a normal mood and affect. His behavior is normal. Judgment and thought content normal.    Vital signs in last 24 hours:    Labs:   Estimated body mass index is 31.05 kg/m as calculated from the following:   Height as of 05/21/19: 6\' 1"  (1.854 m).   Weight as of 05/21/19: 106.8 kg.   Imaging Review Plain radiographs demonstrate severe degenerative joint disease of the left knee(s). The overall alignment isneutral. The bone quality appears to be good for age and reported activity level.      Assessment/Plan:  End stage primary arthritis, left knee   The patient history, physical examination, clinical judgment of the provider and imaging studies are consistent with end stage degenerative joint disease of the left knee(s) and total knee arthroplasty is deemed medically necessary. The treatment  options including medical management, injection therapy arthroscopy and arthroplasty were discussed at length. The risks and benefits of total knee arthroplasty were presented and reviewed. The risks due to aseptic loosening, infection, stiffness, patella tracking problems, thromboembolic complications and other imponderables were discussed. The patient acknowledged the explanation, agreed to proceed with the plan and consent was signed. Patient is being admitted for inpatient treatment for surgery, pain control, PT, OT, prophylactic antibiotics, VTE prophylaxis, progressive ambulation and ADL's and discharge planning. The patient is planning to be discharged home with home health services     Patient's anticipated LOS is less than 2 midnights, meeting these requirements: - Younger than 51 - Lives within 1 hour of care - Has a competent adult  at home to recover with post-op recover - NO history of  - Chronic pain requiring opiods  - Diabetes  - Coronary Artery Disease  - Heart failure  - Heart attack  - Stroke  - DVT/VTE  - Cardiac arrhythmia  - Respiratory Failure/COPD  - Renal failure  - Anemia  - Advanced Liver disease

## 2019-05-25 NOTE — Anesthesia Preprocedure Evaluation (Addendum)
Anesthesia Evaluation  Patient identified by MRN, date of birth, ID band Patient awake    Reviewed: Allergy & Precautions, H&P , Patient's Chart, lab work & pertinent test results, reviewed documented beta blocker date and time   Airway Mallampati: II  TM Distance: >3 FB Neck ROM: full    Dental no notable dental hx. (+) Poor Dentition, Dental Advisory Given   Pulmonary neg pulmonary ROS,    Pulmonary exam normal breath sounds clear to auscultation       Cardiovascular negative cardio ROS   Rhythm:regular Rate:Normal     Neuro/Psych negative neurological ROS  negative psych ROS   GI/Hepatic negative GI ROS, Neg liver ROS,   Endo/Other  negative endocrine ROS  Renal/GU negative Renal ROS  negative genitourinary   Musculoskeletal  (+) Arthritis , Osteoarthritis,    Abdominal Normal abdominal exam  (+)   Peds  Hematology negative hematology ROS (+)   Anesthesia Other Findings   Reproductive/Obstetrics negative OB ROS                            Anesthesia Physical  Anesthesia Plan  ASA: II  Anesthesia Plan: Regional and Spinal   Post-op Pain Management:  Regional for Post-op pain   Induction:   PONV Risk Score and Plan: 1 and Ondansetron  Airway Management Planned: Nasal Cannula and Natural Airway  Additional Equipment: None  Intra-op Plan:   Post-operative Plan:   Informed Consent: I have reviewed the patients History and Physical, chart, labs and discussed the procedure including the risks, benefits and alternatives for the proposed anesthesia with the patient or authorized representative who has indicated his/her understanding and acceptance.     Dental advisory given  Plan Discussed with: CRNA  Anesthesia Plan Comments: ( )       Anesthesia Quick Evaluation

## 2019-05-26 ENCOUNTER — Ambulatory Visit (HOSPITAL_COMMUNITY): Payer: BC Managed Care – PPO | Admitting: Certified Registered Nurse Anesthetist

## 2019-05-26 ENCOUNTER — Telehealth (HOSPITAL_COMMUNITY): Payer: Self-pay | Admitting: *Deleted

## 2019-05-26 ENCOUNTER — Encounter (HOSPITAL_COMMUNITY): Payer: Self-pay

## 2019-05-26 ENCOUNTER — Encounter (HOSPITAL_COMMUNITY)
Admission: RE | Disposition: A | Discharge status: Home / Self Care | Payer: Self-pay | Source: Home / Self Care | Attending: Orthopaedic Surgery

## 2019-05-26 ENCOUNTER — Observation Stay (HOSPITAL_COMMUNITY)
Admission: RE | Admit: 2019-05-26 | Discharge: 2019-05-27 | Disposition: A | Discharge status: Home / Self Care | Payer: BC Managed Care – PPO | Attending: Orthopaedic Surgery | Admitting: Orthopaedic Surgery

## 2019-05-26 ENCOUNTER — Other Ambulatory Visit: Payer: Self-pay

## 2019-05-26 ENCOUNTER — Ambulatory Visit (HOSPITAL_COMMUNITY): Payer: BC Managed Care – PPO | Admitting: Physician Assistant

## 2019-05-26 DIAGNOSIS — M1712 Unilateral primary osteoarthritis, left knee: Principal | ICD-10-CM | POA: Insufficient documentation

## 2019-05-26 DIAGNOSIS — Z96651 Presence of right artificial knee joint: Secondary | ICD-10-CM | POA: Diagnosis not present

## 2019-05-26 DIAGNOSIS — Z96612 Presence of left artificial shoulder joint: Secondary | ICD-10-CM | POA: Diagnosis not present

## 2019-05-26 HISTORY — PX: TOTAL KNEE ARTHROPLASTY: SHX125

## 2019-05-26 LAB — TYPE AND SCREEN
ABO/RH(D): B POS
Antibody Screen: NEGATIVE

## 2019-05-26 SURGERY — ARTHROPLASTY, KNEE, TOTAL
Anesthesia: Regional | Site: Knee | Laterality: Left

## 2019-05-26 MED ORDER — ALUM & MAG HYDROXIDE-SIMETH 200-200-20 MG/5ML PO SUSP: 30.0000 mL | ORAL | Status: DC | PRN

## 2019-05-26 MED ORDER — TRANEXAMIC ACID-NACL 1000-0.7 MG/100ML-% IV SOLN
1000.0000 mg | INTRAVENOUS | Status: AC
  Administered 2019-05-26: 1000 mg via INTRAVENOUS
  Filled 2019-05-26: qty 100

## 2019-05-26 MED ORDER — SODIUM CHLORIDE (PF) 0.9 % IJ SOLN
INTRAMUSCULAR | Status: AC
  Filled 2019-05-26: qty 50

## 2019-05-26 MED ORDER — METOCLOPRAMIDE HCL 5 MG PO TABS: 5.0000 mg | ORAL_TABLET | Freq: Three times a day (TID) | ORAL | Status: DC | PRN

## 2019-05-26 MED ORDER — LACTATED RINGERS IV SOLN
INTRAVENOUS | Status: DC
  Administered 2019-05-26 (×3): via INTRAVENOUS

## 2019-05-26 MED ORDER — ACETAMINOPHEN 500 MG PO TABS
500.0000 mg | ORAL_TABLET | Freq: Four times a day (QID) | ORAL | Status: DC
  Administered 2019-05-26 – 2019-05-27 (×3): 500 mg via ORAL
  Filled 2019-05-26 (×3): qty 1

## 2019-05-26 MED ORDER — SODIUM CHLORIDE 0.9 % IR SOLN
Status: DC | PRN
  Administered 2019-05-26: 3000 mL

## 2019-05-26 MED ORDER — TRANEXAMIC ACID 1000 MG/10ML IV SOLN
INTRAVENOUS | Status: DC | PRN
  Administered 2019-05-26: 2000 mg via TOPICAL

## 2019-05-26 MED ORDER — BISACODYL 5 MG PO TBEC: 5.0000 mg | DELAYED_RELEASE_TABLET | Freq: Every day | ORAL | Status: DC | PRN

## 2019-05-26 MED ORDER — MENTHOL 3 MG MT LOZG: 1.0000 | LOZENGE | OROMUCOSAL | Status: DC | PRN

## 2019-05-26 MED ORDER — SODIUM CHLORIDE 0.9 % IJ SOLN
INTRAMUSCULAR | Status: DC | PRN
  Administered 2019-05-26: 30 mL

## 2019-05-26 MED ORDER — POVIDONE-IODINE 10 % EX SWAB
2.0000 "application " | Freq: Once | CUTANEOUS | Status: AC
  Administered 2019-05-26: 2 via TOPICAL

## 2019-05-26 MED ORDER — HYDROMORPHONE HCL 1 MG/ML IJ SOLN: 0.2500 mg | INTRAMUSCULAR | Status: DC | PRN

## 2019-05-26 MED ORDER — OXYCODONE HCL 5 MG/5ML PO SOLN: 5.0000 mg | Freq: Once | ORAL | Status: DC | PRN

## 2019-05-26 MED ORDER — PROPOFOL 10 MG/ML IV BOLUS
INTRAVENOUS | Status: AC
  Filled 2019-05-26: qty 20

## 2019-05-26 MED ORDER — ONDANSETRON HCL 4 MG/2ML IJ SOLN
INTRAMUSCULAR | Status: AC
  Filled 2019-05-26: qty 2

## 2019-05-26 MED ORDER — ASPIRIN 81 MG PO CHEW
81.0000 mg | CHEWABLE_TABLET | Freq: Two times a day (BID) | ORAL | Status: DC
  Administered 2019-05-27: 81 mg via ORAL
  Filled 2019-05-26: qty 1

## 2019-05-26 MED ORDER — PHENOL 1.4 % MT LIQD: 1.0000 | OROMUCOSAL | Status: DC | PRN

## 2019-05-26 MED ORDER — OXYCODONE HCL 5 MG PO TABS: 5.0000 mg | ORAL_TABLET | Freq: Once | ORAL | Status: DC | PRN

## 2019-05-26 MED ORDER — KETOROLAC TROMETHAMINE 30 MG/ML IJ SOLN: 30.0000 mg | Freq: Once | INTRAMUSCULAR | Status: DC | PRN

## 2019-05-26 MED ORDER — BUPIVACAINE IN DEXTROSE 0.75-8.25 % IT SOLN
INTRATHECAL | Status: DC | PRN
  Administered 2019-05-26: 1.8 mL via INTRATHECAL

## 2019-05-26 MED ORDER — DEXAMETHASONE SODIUM PHOSPHATE 10 MG/ML IJ SOLN
INTRAMUSCULAR | Status: DC | PRN
  Administered 2019-05-26: 10 mg via INTRAVENOUS

## 2019-05-26 MED ORDER — CHLORHEXIDINE GLUCONATE 4 % EX LIQD: 60.0000 mL | Freq: Once | CUTANEOUS | Status: DC

## 2019-05-26 MED ORDER — LACTATED RINGERS IV SOLN
INTRAVENOUS | Status: DC
  Administered 2019-05-27: 06:00:00 via INTRAVENOUS

## 2019-05-26 MED ORDER — METHOCARBAMOL 500 MG IVPB - SIMPLE MED
500.0000 mg | Freq: Four times a day (QID) | INTRAVENOUS | Status: DC | PRN
  Filled 2019-05-26: qty 50

## 2019-05-26 MED ORDER — MIDAZOLAM HCL 2 MG/2ML IJ SOLN
1.0000 mg | Freq: Once | INTRAMUSCULAR | Status: AC
  Administered 2019-05-26: 09:00:00 2 mg via INTRAVENOUS
  Filled 2019-05-26: qty 2

## 2019-05-26 MED ORDER — ONDANSETRON HCL 4 MG/2ML IJ SOLN: 4.0000 mg | Freq: Four times a day (QID) | INTRAMUSCULAR | Status: DC | PRN

## 2019-05-26 MED ORDER — BUPIVACAINE-EPINEPHRINE 0.5% -1:200000 IJ SOLN
INTRAMUSCULAR | Status: DC | PRN
  Administered 2019-05-26: 30 mL

## 2019-05-26 MED ORDER — DIPHENHYDRAMINE HCL 12.5 MG/5ML PO ELIX: 12.5000 mg | ORAL_SOLUTION | ORAL | Status: DC | PRN

## 2019-05-26 MED ORDER — KETOROLAC TROMETHAMINE 15 MG/ML IJ SOLN
7.5000 mg | Freq: Four times a day (QID) | INTRAMUSCULAR | Status: DC
  Administered 2019-05-26 – 2019-05-27 (×3): 7.5 mg via INTRAVENOUS
  Filled 2019-05-26 (×3): qty 1

## 2019-05-26 MED ORDER — BUPIVACAINE-EPINEPHRINE 0.5% -1:200000 IJ SOLN
INTRAMUSCULAR | Status: AC
  Filled 2019-05-26: qty 1

## 2019-05-26 MED ORDER — MEPERIDINE HCL 50 MG/ML IJ SOLN: 6.2500 mg | INTRAMUSCULAR | Status: DC | PRN

## 2019-05-26 MED ORDER — DOCUSATE SODIUM 100 MG PO CAPS
100.0000 mg | ORAL_CAPSULE | Freq: Two times a day (BID) | ORAL | Status: DC
  Administered 2019-05-26 – 2019-05-27 (×2): 100 mg via ORAL
  Filled 2019-05-26 (×2): qty 1

## 2019-05-26 MED ORDER — PROPOFOL 10 MG/ML IV BOLUS
INTRAVENOUS | Status: DC | PRN
  Administered 2019-05-26: 10 mg via INTRAVENOUS
  Administered 2019-05-26: 20 mg via INTRAVENOUS
  Administered 2019-05-26: 10 mg via INTRAVENOUS
  Administered 2019-05-26: 20 mg via INTRAVENOUS
  Administered 2019-05-26: 10 mg via INTRAVENOUS
  Administered 2019-05-26: 20 mg via INTRAVENOUS

## 2019-05-26 MED ORDER — CEFAZOLIN SODIUM-DEXTROSE 2-4 GM/100ML-% IV SOLN
2.0000 g | INTRAVENOUS | Status: AC
  Administered 2019-05-26: 2 g via INTRAVENOUS
  Filled 2019-05-26: qty 100

## 2019-05-26 MED ORDER — 0.9 % SODIUM CHLORIDE (POUR BTL) OPTIME
TOPICAL | Status: DC | PRN
  Administered 2019-05-26: 1000 mL

## 2019-05-26 MED ORDER — ACETAMINOPHEN 325 MG PO TABS: 325.0000 mg | ORAL_TABLET | Freq: Four times a day (QID) | ORAL | Status: DC | PRN

## 2019-05-26 MED ORDER — METOCLOPRAMIDE HCL 5 MG/ML IJ SOLN: 5.0000 mg | Freq: Three times a day (TID) | INTRAMUSCULAR | Status: DC | PRN

## 2019-05-26 MED ORDER — PROMETHAZINE HCL 25 MG/ML IJ SOLN: 6.2500 mg | INTRAMUSCULAR | Status: DC | PRN

## 2019-05-26 MED ORDER — PROPOFOL 10 MG/ML IV BOLUS
INTRAVENOUS | Status: AC
  Filled 2019-05-26: qty 60

## 2019-05-26 MED ORDER — MORPHINE SULFATE (PF) 4 MG/ML IV SOLN: 0.5000 mg | INTRAVENOUS | Status: DC | PRN

## 2019-05-26 MED ORDER — FENTANYL CITRATE (PF) 100 MCG/2ML IJ SOLN
50.0000 ug | Freq: Once | INTRAMUSCULAR | Status: AC
  Administered 2019-05-26: 09:00:00 100 ug via INTRAVENOUS
  Filled 2019-05-26: qty 2

## 2019-05-26 MED ORDER — HYDROCODONE-ACETAMINOPHEN 7.5-325 MG PO TABS: 1.0000 | ORAL_TABLET | ORAL | Status: DC | PRN

## 2019-05-26 MED ORDER — ROPIVACAINE HCL 5 MG/ML IJ SOLN
INTRAMUSCULAR | Status: DC | PRN
  Administered 2019-05-26: 20 mL via EPIDURAL

## 2019-05-26 MED ORDER — ONDANSETRON HCL 4 MG PO TABS: 4.0000 mg | ORAL_TABLET | Freq: Four times a day (QID) | ORAL | Status: DC | PRN

## 2019-05-26 MED ORDER — HYDROCODONE-ACETAMINOPHEN 5-325 MG PO TABS
1.0000 | ORAL_TABLET | ORAL | Status: DC | PRN
  Filled 2019-05-26: qty 1

## 2019-05-26 MED ORDER — ACETAMINOPHEN 500 MG PO TABS
1000.0000 mg | ORAL_TABLET | Freq: Once | ORAL | Status: AC
  Administered 2019-05-26: 09:00:00 1000 mg via ORAL
  Filled 2019-05-26: qty 2

## 2019-05-26 MED ORDER — BUPIVACAINE LIPOSOME 1.3 % IJ SUSP
INTRAMUSCULAR | Status: DC | PRN
  Administered 2019-05-26: 20 mL

## 2019-05-26 MED ORDER — STERILE WATER FOR IRRIGATION IR SOLN
Status: DC | PRN
  Administered 2019-05-26: 2000 mL

## 2019-05-26 MED ORDER — TRANEXAMIC ACID-NACL 1000-0.7 MG/100ML-% IV SOLN
1000.0000 mg | Freq: Once | INTRAVENOUS | Status: AC
  Administered 2019-05-26: 16:00:00 1000 mg via INTRAVENOUS
  Filled 2019-05-26: qty 100

## 2019-05-26 MED ORDER — CEFAZOLIN SODIUM-DEXTROSE 2-4 GM/100ML-% IV SOLN
2.0000 g | Freq: Four times a day (QID) | INTRAVENOUS | Status: AC
  Administered 2019-05-26 (×2): 2 g via INTRAVENOUS
  Filled 2019-05-26 (×2): qty 100

## 2019-05-26 MED ORDER — SODIUM CHLORIDE 0.9 % IV SOLN
INTRAVENOUS | Status: DC | PRN
  Administered 2019-05-26: 40 ug/min via INTRAVENOUS

## 2019-05-26 MED ORDER — PROPOFOL 500 MG/50ML IV EMUL
INTRAVENOUS | Status: DC | PRN
  Administered 2019-05-26: 50 ug/kg/min via INTRAVENOUS

## 2019-05-26 MED ORDER — ONDANSETRON HCL 4 MG/2ML IJ SOLN
INTRAMUSCULAR | Status: DC | PRN
  Administered 2019-05-26: 4 mg via INTRAVENOUS

## 2019-05-26 MED ORDER — METHOCARBAMOL 500 MG PO TABS: 500.0000 mg | ORAL_TABLET | Freq: Four times a day (QID) | ORAL | Status: DC | PRN

## 2019-05-26 SURGICAL SUPPLY — 53 items
ATTUNE MED DOME PAT 41 KNEE (Knees) ×2 IMPLANT
ATTUNE MED DOME PAT 41MM KNEE (Knees) ×1 IMPLANT
ATTUNE PS FEM LT SZ 7 CEM KNEE (Femur) ×3 IMPLANT
ATTUNE PSRP INSR SZ7 5 KNEE (Insert) ×2 IMPLANT
ATTUNE PSRP INSR SZ7 5MM KNEE (Insert) ×1 IMPLANT
BAG DECANTER FOR FLEXI CONT (MISCELLANEOUS) ×3 IMPLANT
BAG ZIPLOCK 12X15 (MISCELLANEOUS) ×3 IMPLANT
BASE TIBIAL ATTUNE KNEE SZ9 (Knees) ×1 IMPLANT
BLADE SAGITTAL 25.0X1.19X90 (BLADE) ×2 IMPLANT
BLADE SAGITTAL 25.0X1.19X90MM (BLADE) ×1
BLADE SAW SGTL 11.0X1.19X90.0M (BLADE) ×3 IMPLANT
BNDG ELASTIC 6X10 VLCR STRL LF (GAUZE/BANDAGES/DRESSINGS) ×3 IMPLANT
BNDG ELASTIC 6X5.8 VLCR STR LF (GAUZE/BANDAGES/DRESSINGS) ×3 IMPLANT
BOOTIES KNEE HIGH SLOAN (MISCELLANEOUS) ×3 IMPLANT
BOWL SMART MIX CTS (DISPOSABLE) ×3 IMPLANT
CEMENT HV SMART SET (Cement) ×6 IMPLANT
COVER SURGICAL LIGHT HANDLE (MISCELLANEOUS) ×3 IMPLANT
COVER WAND RF STERILE (DRAPES) ×3 IMPLANT
CUFF TOURN SGL QUICK 34 (TOURNIQUET CUFF) ×2
CUFF TRNQT CYL 34X4.125X (TOURNIQUET CUFF) ×1 IMPLANT
DECANTER SPIKE VIAL GLASS SM (MISCELLANEOUS) ×6 IMPLANT
DRAPE SHEET LG 3/4 BI-LAMINATE (DRAPES) ×3 IMPLANT
DRAPE TOP 10253 STERILE (DRAPES) ×3 IMPLANT
DRAPE U-SHAPE 47X51 STRL (DRAPES) ×3 IMPLANT
DRSG AQUACEL AG ADV 3.5X10 (GAUZE/BANDAGES/DRESSINGS) ×3 IMPLANT
DURAPREP 26ML APPLICATOR (WOUND CARE) ×6 IMPLANT
ELECT REM PT RETURN 15FT ADLT (MISCELLANEOUS) ×3 IMPLANT
GLOVE BIO SURGEON STRL SZ8 (GLOVE) ×6 IMPLANT
GLOVE BIOGEL PI IND STRL 8 (GLOVE) ×2 IMPLANT
GLOVE BIOGEL PI INDICATOR 8 (GLOVE) ×4
GOWN STRL REUS W/TWL XL LVL3 (GOWN DISPOSABLE) ×6 IMPLANT
HANDPIECE INTERPULSE COAX TIP (DISPOSABLE) ×2
HOLDER FOLEY CATH W/STRAP (MISCELLANEOUS) ×3 IMPLANT
HOOD PEEL AWAY FLYTE STAYCOOL (MISCELLANEOUS) ×9 IMPLANT
KIT TURNOVER KIT A (KITS) IMPLANT
MANIFOLD NEPTUNE II (INSTRUMENTS) ×3 IMPLANT
NS IRRIG 1000ML POUR BTL (IV SOLUTION) ×3 IMPLANT
PACK TOTAL KNEE CUSTOM (KITS) ×3 IMPLANT
PAD ARMBOARD 7.5X6 YLW CONV (MISCELLANEOUS) ×3 IMPLANT
PIN DRILL FIX HALF THREAD (BIT) ×3 IMPLANT
PIN STEINMAN FIXATION KNEE (PIN) ×3 IMPLANT
PROTECTOR NERVE ULNAR (MISCELLANEOUS) ×3 IMPLANT
SET HNDPC FAN SPRY TIP SCT (DISPOSABLE) ×1 IMPLANT
SUT ETHIBOND NAB CT1 #1 30IN (SUTURE) ×6 IMPLANT
SUT VIC AB 0 CT1 36 (SUTURE) ×6 IMPLANT
SUT VIC AB 2-0 CT1 27 (SUTURE) ×2
SUT VIC AB 2-0 CT1 TAPERPNT 27 (SUTURE) ×1 IMPLANT
SUT VIC AB 3-0 CT1 27 (SUTURE) ×2
SUT VIC AB 3-0 CT1 TAPERPNT 27 (SUTURE) ×1 IMPLANT
TIBIAL BASE ATTUNE KNEE SZ9 (Knees) ×3 IMPLANT
TRAY FOLEY MTR SLVR 16FR STAT (SET/KITS/TRAYS/PACK) ×3 IMPLANT
WATER STERILE IRR 1000ML POUR (IV SOLUTION) ×3 IMPLANT
WRAP KNEE MAXI GEL POST OP (GAUZE/BANDAGES/DRESSINGS) ×3 IMPLANT

## 2019-05-26 NOTE — Plan of Care (Signed)
Continue current POC 

## 2019-05-26 NOTE — Op Note (Signed)
PREOP DIAGNOSIS: DJD LEFT KNEE POSTOP DIAGNOSIS:  same PROCEDURE: LEFT TKR ANESTHESIA: Spinal and MAC ATTENDING SURGEON: Hessie Dibble ASSISTANT: Loni Dolly PA  INDICATIONS FOR PROCEDURE: Anthony Kane is a 68 y.o. male who has struggled for a long time with pain due to degenerative arthritis of the left knee.  The patient has failed many conservative non-operative measures and at this point has pain which limits the ability to sleep and walk.  The patient is offered total knee replacement.  Informed operative consent was obtained after discussion of possible risks of anesthesia, infection, neurovascular injury, DVT, and death.  The importance of the post-operative rehabilitation protocol to optimize result was stressed extensively with the patient.  SUMMARY OF FINDINGS AND PROCEDURE:  Anthony Kane was taken to the operative suite where under the above anesthesia a left knee replacement was performed.  There were advanced degenerative changes and the bone quality was excellent.  We used the DePuyAttune system and placed size 7 femur, 9 tibia, 41 mm all polyethylene patella, and a size 5 mm spacer.  Loni Dolly PA-C assisted throughout and was invaluable to the completion of the case in that he helped retract and maintain exposure while I placed the components.  He also helped close thereby minimizing OR time.  The patient was admitted for appropriate post-op care to include perioperative antibiotics and mechanical and pharmacologic measures for DVT prophylaxis.  DESCRIPTION OF PROCEDURE:  Anthony Kane was taken to the operative suite where the above anesthesia was applied.  The patient was positioned supine and prepped and draped in normal sterile fashion.  An appropriate time out was performed.  After the administration of kefzol pre-op antibiotic the leg was elevated and exsanguinated and a tourniquet inflated.  A standard longitudinal incision was made on the anterior knee.  Dissection  was carried down to the extensor mechanism.  All appropriate anti-infective measures were used including the pre-operative antibiotic, betadine impregnated drape, and closed hooded exhaust systems for each member of the surgical team.  A medial parapatellar incision was made in the extensor mechanism and the knee cap flipped and the knee flexed.  Some residual meniscal tissues were removed along with any remaining ACL/PCL tissue.  A guide was placed on the tibia and a flat cut was made on it's superior surface.  An intramedullary guide was placed in the femur and was utilized to make anterior and posterior cuts creating an appropriate flexion gap.  A second intramedullary guide was placed in the femur to make a distal cut properly balancing the knee with an extension gap equal to the flexion gap.  The three bones sized to the above mentioned sizes and the appropriate guides were placed and utilized.  A trial reduction was done and the knee easily came to full extension and the patella tracked well on flexion.  The trial components were removed and all bones were cleaned with pulsatile lavage and then dried thoroughly.  Cement was mixed and was pressurized onto the bones followed by placement of the aforementioned components.  Excess cement was trimmed and pressure was held on the components until the cement had hardened.  The tourniquet was deflated and a small amount of bleeding was controlled with cautery and pressure.  The knee was irrigated thoroughly.  The extensor mechanism was re-approximated with #1 ethibond in interrupted fashion.  The knee was flexed and the repair was solid.  The subcutaneous tissues were re-approximated with #0 and #2-0 vicryl and the skin  closed with a subcuticular stitch and steristrips.  A sterile dressing was applied.  Intraoperative fluids, EBL, and tourniquet time can be obtained from anesthesia records.  DISPOSITION:  The patient was taken to recovery room in stable condition  and admitted for appropriate post-op care to include peri-operative antibiotic and DVT prophylaxis with mechanical and pharmacologic measures.  Hessie Dibble 05/26/2019, 11:52 AM

## 2019-05-26 NOTE — Interval H&P Note (Signed)
History and Physical Interval Note:  05/26/2019 9:14 AM  Anthony Kane  has presented today for surgery, with the diagnosis of LEFT KNEE DEGENERATIVE JOINT DISEASE.  The various methods of treatment have been discussed with the patient and family. After consideration of risks, benefits and other options for treatment, the patient has consented to  Procedure(s): LEFT TOTAL KNEE ARTHROPLASTY (Left) as a surgical intervention.  The patient's history has been reviewed, patient examined, no change in status, stable for surgery.  I have reviewed the patient's chart and labs.  Questions were answered to the patient's satisfaction.     Hessie Dibble

## 2019-05-26 NOTE — Evaluation (Signed)
Physical Therapy Evaluation Patient Details Name: Anthony Kane MRN: [id number] DOB: May 02, 1951 Today's Date: 05/26/2019   History of Present Illness  Pt is 68 y.o. 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    Clinical Impression  Anthony Kane is a 68 y.o. male POD 0 s/p Lt TKA. Patient reports independence with mobility with no device at baseline. Patient is now limited by functional impairments (see PT problem list below) and requires min guard for transfers and gait with RW. Patient was able to ambulate ~80 feet with RW and required minimal cues for safe use of RW. He has crutches at home and would prefer to use this if he can practice with crutches in the hospital for gait and stair training. Patient instructed in exercise to facilitate ROM and circulation to manage edema. Patient will benefit from continued skilled PT interventions to address impairments and progress towards PLOF. Acute PT will follow to progress mobility and stair training in preparation for safe discharge home.     Follow Up Recommendations Follow surgeon's recommendation for DC plan and follow-up therapies    Equipment Recommendations  Other (comment)(pt already owns crutches, may need RW if he does not perform gait safely with crutches at next PT session)    Recommendations for Other Services       Precautions / Restrictions Precautions Precautions: Fall Restrictions Weight Bearing Restrictions: No      Mobility  Bed Mobility Overal bed mobility: Modified Independent             General bed mobility comments: pt wtih HOB slightly elevated, no use of bed rails, no cues needed  Transfers Overall transfer level: Needs assistance Equipment used: Rolling walker (2 wheeled) Transfers: Sit to/from Stand Sit to Stand: Min guard         General transfer comment: cues provided for safe sequencing and hand placement  however pt pushing up with bil UE from bed to stand; no overt LOB or unsteadiness upon rising noted  Ambulation/Gait Ambulation/Gait assistance: Min guard Gait Distance (Feet): 80 Feet Assistive device: Rolling walker (2 wheeled) Gait Pattern/deviations: Step-through pattern;Decreased step length - right;Decreased stance time - left;Decreased stride length Gait velocity: slightly decreased   General Gait Details: cues for safe hand placement initially with RW , pt maintained safe proximety throughout with min cuing  Stairs            Wheelchair Mobility    Modified Rankin (Stroke Patients Only)       Balance Overall balance assessment: Needs assistance Sitting-balance support: No upper extremity supported;Feet supported Sitting balance-Leahy Scale: Good     Standing balance support: During functional activity;Bilateral upper extremity supported Standing balance-Leahy Scale: Fair Standing balance comment: pt requires UE support for dynamic standing and gait              Pertinent Vitals/Pain Pain Assessment: 0-10 Pain Score: 1  Pain Location: Lt knee Pain Descriptors / Indicators: Aching;Sore Pain Intervention(s): Limited activity within patient's tolerance;Monitored during session;Ice applied    Home Living Family/patient expects to be discharged to:: Private residence Living Arrangements: Spouse/significant other Available Help at Discharge: Family;Available 24 hours/day Type of Home: House Home Access: Stairs to enter Entrance Stairs-Rails: None Entrance Stairs-Number of Steps: 2 Home Layout: Multi-level;Bed/bath upstairs;Laundry or work area in [employer]: Crutches      Prior Function Level of Independence: Independent  Comments: pt is very active and participates in weight lifting at the gym multiple times per week, he is working full time as a Nurse, adult   Dominant Hand: Left    Extremity/Trunk  Assessment   Upper Extremity Assessment Upper Extremity Assessment: Overall WFL for tasks assessed    Lower Extremity Assessment Lower Extremity Assessment: Overall WFL for tasks assessed;LLE deficits/detail LLE Deficits / Details: pt with good quad activation on Lt LE, able to maintain SLR wtih no extensor lag; strength WFL on Rt LE    Cervical / Trunk Assessment Cervical / Trunk Assessment: Normal  Communication   Communication: No difficulties  Cognition Arousal/Alertness: Awake/alert Behavior During Therapy: WFL for tasks assessed/performed Overall Cognitive Status: Within Functional Limits for tasks assessed             General Comments      Exercises Total Joint Exercises Ankle Circles/Pumps: AROM;10 reps;Seated;Both Quad Sets: AROM;5 reps;Supine;Seated;Left(2 sets (1x 5 supine, 1x5 seated)) Heel Slides: AROM;5 reps;Seated;Left   Assessment/Plan    PT Assessment Patient needs continued PT services  PT Problem List Decreased strength;Decreased balance;Decreased range of motion;Decreased mobility;Decreased knowledge of use of DME;Decreased activity tolerance       PT Treatment Interventions DME instruction;Functional mobility training;Balance training;Patient/family education;Modalities;Gait training;Therapeutic activities;Therapeutic exercise;Stair training    PT Goals (Current goals can be found in the Care Plan section)  Acute Rehab PT Goals Patient Stated Goal: to use crutches for walking and return home; ultimately to get back into the gym and lift again PT Goal Formulation: With patient Time For Goal Achievement: 06/02/19 Potential to Achieve Goals: Good    Frequency 7X/week    AM-PAC PT "6 Clicks" Mobility  Outcome Measure Help needed turning from your back to your side while in a flat bed without using bedrails?: A Little Help needed moving from lying on your back to sitting on the side of a flat bed without using bedrails?: A Little Help needed  moving to and from a bed to a chair (including a wheelchair)?: A Little Help needed standing up from a chair using your arms (e.g., wheelchair or bedside chair)?: A Little Help needed to walk in hospital room?: A Little Help needed climbing 3-5 steps with a railing? : A Little 6 Click Score: 18    End of Session Equipment Utilized During Treatment: Gait belt Activity Tolerance: Patient tolerated treatment well Patient left: in chair;with call bell/phone within reach;with chair alarm set Nurse Communication: Mobility status;Other (comment)(pt requesting his subway that his wife dropped off) PT Visit Diagnosis: Muscle weakness (generalized) (M62.81);Difficulty in walking, not elsewhere classified (R26.2)    Time: [id number] PT Time Calculation (min) (ACUTE ONLY): 30 min   Charges:   PT Evaluation $PT Eval Low Complexity: 1 Low PT Treatments $Therapeutic Exercise: 8-22 mins       Kipp Brood, PT, DPT, [facility] Physical Therapist with [facility]  05/26/2019 6:30 PM

## 2019-05-26 NOTE — Progress Notes (Signed)
Assisted Dr. Beth Finucane with left, ultrasound guided, adductor canal block. Side rails up, monitors on throughout procedure. See vital signs in flow sheet. Tolerated Procedure well. ° °

## 2019-05-26 NOTE — Anesthesia Postprocedure Evaluation (Signed)
Anesthesia Post Note  Patient: Anthony Kane  Procedure(s) Performed: LEFT TOTAL KNEE ARTHROPLASTY (Left Knee)     Patient location during evaluation: PACU Anesthesia Type: Regional and Spinal Level of consciousness: awake and alert Pain management: pain level controlled Vital Signs Assessment: post-procedure vital signs reviewed and stable Respiratory status: spontaneous breathing, nonlabored ventilation, respiratory function stable and patient connected to nasal cannula oxygen Cardiovascular status: stable and blood pressure returned to baseline Postop Assessment: no apparent nausea or vomiting and spinal receding Anesthetic complications: no    Last Vitals:  Vitals:   05/26/19 1330 05/26/19 1354  BP: 108/71 102/70  Pulse: 64 61  Resp: 19 16  Temp: (!) 36.4 C (!) 36.4 C  SpO2: 94% 93%    Last Pain:  Vitals:   05/26/19 1354  TempSrc:   PainSc: 0-No pain                 Pervis Hocking

## 2019-05-26 NOTE — Anesthesia Procedure Notes (Addendum)
Anesthesia Regional Block: Adductor canal block   Pre-Anesthetic Checklist: ,, timeout performed, Correct Patient, Correct Site, Correct Laterality, Correct Procedure, Correct Position, site marked, Risks and benefits discussed,  Surgical consent,  Pre-op evaluation,  At surgeon's request and post-op pain management  Laterality: Left  Prep: Maximum Sterile Barrier Precautions used, chloraprep       Needles:  Injection technique: Single-shot  Needle Type: Echogenic Stimulator Needle     Needle Length: 9cm  Needle Gauge: 22     Additional Needles:   Procedures:,,,, ultrasound used (permanent image in chart),,,,  Narrative:  Start time: 05/26/2019 9:00 AM End time: 05/26/2019 9:10 AM Injection made incrementally with aspirations every 5 mL.  Performed by: Personally  Anesthesiologist: Pervis Hocking, DO  Additional Notes: Monitors applied. No increased pain on injection. No increased resistance to injection. Injection made in 5cc increments. Good needle visualization. Patient tolerated procedure well.

## 2019-05-26 NOTE — Plan of Care (Signed)
Plan of care 

## 2019-05-26 NOTE — Transfer of Care (Signed)
Immediate Anesthesia Transfer of Care Note  Patient: Anthony Kane  Procedure(s) Performed: LEFT TOTAL KNEE ARTHROPLASTY (Left Knee)  Patient Location: PACU  Anesthesia Type:Spinal  Level of Consciousness: drowsy and patient cooperative  Airway & Oxygen Therapy: Patient Spontanous Breathing and Patient connected to face mask oxygen  Post-op Assessment: Report given to RN and Post -op Vital signs reviewed and stable  Post vital signs: Reviewed and stable  Last Vitals:  Vitals Value Taken Time  BP 101/62 05/26/19 1220  Temp    Pulse 78 05/26/19 1223  Resp 12 05/26/19 1223  SpO2 96 % 05/26/19 1223  Vitals shown include unvalidated device data.  Last Pain:  Vitals:   05/26/19 0810  TempSrc: Oral         Complications: No apparent anesthesia complications

## 2019-05-26 NOTE — Anesthesia Procedure Notes (Signed)
Spinal  Patient location during procedure: OR Start time: 05/26/2019 10:10 AM End time: 05/26/2019 10:15 AM Staffing Anesthesiologist: Pervis Hocking, DO Resident/CRNA: Montel Clock, CRNA Performed: resident/CRNA  Preanesthetic Checklist Completed: patient identified, site marked, surgical consent, pre-op evaluation, timeout performed, IV checked, risks and benefits discussed and monitors and equipment checked Spinal Block Patient position: sitting Prep: DuraPrep Patient monitoring: heart rate, cardiac monitor, continuous pulse ox and blood pressure Approach: midline Location: L3-4 Injection technique: single-shot Needle Needle type: Pencan  Needle gauge: 24 G Needle length: 10 cm Needle insertion depth: 7 cm Assessment Sensory level: T6

## 2019-05-27 ENCOUNTER — Encounter (HOSPITAL_COMMUNITY): Payer: Self-pay | Admitting: Orthopaedic Surgery

## 2019-05-27 DIAGNOSIS — M1712 Unilateral primary osteoarthritis, left knee: Secondary | ICD-10-CM | POA: Diagnosis not present

## 2019-05-27 MED ORDER — ASPIRIN 81 MG PO CHEW: 81.0000 mg | CHEWABLE_TABLET | Freq: Two times a day (BID) | ORAL | 0 refills | Status: DC

## 2019-05-27 MED ORDER — TIZANIDINE HCL 4 MG PO TABS: 4.0000 mg | ORAL_TABLET | Freq: Four times a day (QID) | ORAL | 1 refills | Status: AC | PRN

## 2019-05-27 MED ORDER — HYDROCODONE-ACETAMINOPHEN 5-325 MG PO TABS: 1.0000 | ORAL_TABLET | Freq: Four times a day (QID) | ORAL | 0 refills | Status: DC | PRN

## 2019-05-27 NOTE — Plan of Care (Signed)
resolved 

## 2019-05-27 NOTE — Progress Notes (Signed)
Physical Therapy Treatment Patient Details Name: Anthony Kane MRN: [id number] DOB: Jan 15, 1951 Today's Date: 05/27/2019    History of Present Illness Pt is 67 y.o. 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    PT Comments    Pt ambulated 200 ft total with supervision with axillary crutches, which is pt preference for AD upon d/c. PT feels pt is safe to d/c with axillary crutches, pt very steady with transfers, ambulation, and stair navigation with crutches use. Pt proficiently performed stair navigation and TKR exercises, handout administered, reviewed and practiced with pt. Pt with no further questions, defers practicing a flight of stairs with bilateral rails which is what he has to get to his bedroom. Pt states he knows to lead with non-operative leg ascending and non-operative leg descending steps.   Of note, pt states his crutches are adjusted shorter than PT used today. PT recommended pt set crutches for 6 ft with handrests on second spot from top. Pt agrees. Pt with no further acute PT needs.    Follow Up Recommendations  Follow surgeon's recommendation for DC plan and follow-up therapies     Equipment Recommendations  Other (comment)(pt already owns crutches, may need RW if he does not perform gait safely with crutches at next PT session)    Recommendations for Other Services       Precautions / Restrictions Precautions Precautions: Fall Restrictions Weight Bearing Restrictions: No Other Position/Activity Restrictions: WBAT    Mobility  Bed Mobility Overal bed mobility: Modified Independent             General bed mobility comments: increased time to move from supine<>sit.  Transfers Overall transfer level: Modified independent Equipment used: Crutches Transfers: Sit to/from Stand Sit to Stand: Modified independent (Device/Increase time)         General transfer comment: mod  I for use of crutches, pt steady with crutch use  Ambulation/Gait Ambulation/Gait assistance: Supervision Gait Distance (Feet): 100 Feet(100+100 for to and from rehab gym) Assistive device: Crutches Gait Pattern/deviations: Step-through pattern;Decreased stride length;Trunk flexed Gait velocity: WFL   General Gait Details: Supervision for safety, verbal cuing for sequencing with axillary crutches and upright posture. No LOB or unsteadiness noted with axillary crutch use.   Stairs Stairs: Yes Stairs assistance: Supervision Stair Management: Step to pattern;Forwards;With crutches Number of Stairs: 4 General stair comments: supervision for safety, PT reinforcing up with RLE and down with LLE leading. Pt with proper step-to sequencing and crutch placement with stair navigation.   Wheelchair Mobility    Modified Rankin (Stroke Patients Only)       Balance Overall balance assessment: Needs assistance Sitting-balance support: No upper extremity supported;Feet supported Sitting balance-Leahy Scale: Good     Standing balance support: During functional activity;Bilateral upper extremity supported Standing balance-Leahy Scale: Fair Standing balance comment: pt requires UE support for dynamic standing and gait                            Cognition Arousal/Alertness: Awake/alert Behavior During Therapy: WFL for tasks assessed/performed Overall Cognitive Status: Within Functional Limits for tasks assessed                                        Exercises Total Joint Exercises Ankle Circles/Pumps:  AROM;10 reps;Both;Supine Quad Sets: AROM;Supine;Left;10 reps Towel Squeeze: AROM;Left;10 reps;Supine Short Arc Quad: AROM;Left;10 reps;Supine Heel Slides: AROM;Left;10 reps;Supine Hip ABduction/ADduction: AROM;Left;10 reps;Supine Straight Leg Raises: AROM;Left;10 reps;Supine Goniometric ROM: knee AROM 0-90*, limited by pain and stiffness    General Comments         Pertinent Vitals/Pain Pain Assessment: 0-10 Pain Score: 3  Pain Location: Lt knee Pain Descriptors / Indicators: Aching;Sore Pain Intervention(s): Limited activity within patient's tolerance;Monitored during session;Premedicated before session;Repositioned    Home Living                      Prior Function            PT Goals (current goals can now be found in the care plan section) Acute Rehab PT Goals Patient Stated Goal: to use crutches for walking and return home; ultimately to get back into the gym and lift again PT Goal Formulation: With patient Time For Goal Achievement: 06/02/19 Potential to Achieve Goals: Good Progress towards PT goals: Progressing toward goals    Frequency    7X/week      PT Plan Current plan remains appropriate    Co-evaluation              AM-PAC PT "6 Clicks" Mobility   Outcome Measure  Help needed turning from your back to your side while in a flat bed without using bedrails?: None Help needed moving from lying on your back to sitting on the side of a flat bed without using bedrails?: None Help needed moving to and from a bed to a chair (including a wheelchair)?: None Help needed standing up from a chair using your arms (e.g., wheelchair or bedside chair)?: None Help needed to walk in hospital room?: None Help needed climbing 3-5 steps with a railing? : None 6 Click Score: 24    End of Session Equipment Utilized During Treatment: Gait belt Activity Tolerance: Patient tolerated treatment well Patient left: with call bell/phone within reach;in bed(pt verbally agrees to press call button and wait for assist prior to mobilizing) Nurse Communication: Mobility status PT Visit Diagnosis: Muscle weakness (generalized) (M62.81);Difficulty in walking, not elsewhere classified (R26.2)     Time: [id number] PT Time Calculation (min) (ACUTE ONLY): 25 min  Charges:  $Gait Training: 8-22 mins $Therapeutic Exercise: 8-22  mins                    Anthony Kane, PT Acute Rehabilitation Services Pager [phone]  Office [phone]    Anthony Kane 05/27/2019, 9:56 AM

## 2019-05-27 NOTE — Discharge Summary (Signed)
Patient ID: Anthony Kane MRN: [id number] DOB/AGE: 1951-04-02 68 y.o.  Admit date: 05/26/2019 Discharge date: 05/27/2019  Admission Diagnoses:  Principal Problem:   Primary osteoarthritis of left knee   Discharge Diagnoses:  Same  Past Medical History:  Diagnosis Date  . Allergy   . Arthritis    Shoulder and thumb  . Cataract   . History of blood transfusion 1983  . Prediabetes    i was but i changed my diet and had a another check and they did say anything about it , denies     Surgeries: Procedure(s): LEFT TOTAL KNEE ARTHROPLASTY on 05/26/2019   Consultants:   Discharged Condition: Improved  Hospital Course: Anthony Kane is an 68 y.o. male who was admitted 05/26/2019 for operative treatment ofPrimary osteoarthritis of left knee. Patient has severe unremitting pain that affects sleep, daily activities, and work/hobbies. After pre-op clearance the patient was taken to the operating room on 05/26/2019 and underwent  Procedure(s): LEFT TOTAL KNEE ARTHROPLASTY.    Patient was given perioperative antibiotics:  Anti-infectives (From admission, onward)   Start     Dose/Rate Route Frequency Ordered Stop   05/26/19 1600  ceFAZolin (ANCEF) IVPB 2g/100 mL premix     2 g 200 mL/hr over 30 Minutes Intravenous Every 6 hours 05/26/19 1331 05/26/19 2224   05/26/19 0815  ceFAZolin (ANCEF) IVPB 2g/100 mL premix     2 g 200 mL/hr over 30 Minutes Intravenous On call to O.R. 05/26/19 0805 05/26/19 1026       Patient was given sequential compression devices, early ambulation, and chemoprophylaxis to prevent DVT.  Patient benefited maximally from hospital stay and there were no complications.    Recent vital signs:  Patient Vitals for the past 24 hrs:  BP Temp Temp src Pulse Resp SpO2 Height Weight  05/27/19 0450 134/81 97.6 F (36.4 C) Oral 67 16 97 % - -  05/27/19 0123 125/71 97.9 F (36.6 C) Oral 61 16 96 % - -  05/26/19 2226 119/72 98.5 F (36.9 C) Oral 77 16 99 % - -   05/26/19 1711 119/76 97.6 F (36.4 C) - 65 15 97 % - -  05/26/19 1601 123/72 97.6 F (36.4 C) - 68 15 96 % - -  05/26/19 1455 118/72 - - 65 15 97 % - -  05/26/19 1354 102/70 (!) 97.5 F (36.4 C) - 61 16 93 % 6\' 1"  (1.854 m) 106.8 kg  05/26/19 1330 108/71 (!) 97.5 F (36.4 C) - 64 19 94 % - -  05/26/19 1315 94/75 - - 66 18 94 % - -  05/26/19 1300 106/63 (!) 97.5 F (36.4 C) - 62 11 98 % - -  05/26/19 1245 97/70 - - 66 16 97 % - -  05/26/19 1230 94/64 - - 82 11 98 % - -  05/26/19 1220 101/62 (!) 97.5 F (36.4 C) - 80 12 97 % - -  05/26/19 0925 126/76 - - 61 10 96 % - -  05/26/19 0922 - - - 70 17 96 % - -  05/26/19 0920 132/85 - - 67 (!) 21 98 % - -  05/26/19 0919 - - - 71 17 98 % - -  05/26/19 0918 - - - 72 18 98 % - -  05/26/19 0917 - - - 65 17 97 % - -  05/26/19 0916 (!) 161/96 - - 65 15 96 % - -  05/26/19 0810 (!) 142/98 98.1 F (36.7 C) Oral  71 18 94 % - -     Recent laboratory studies: No results for input(s): WBC, HGB, HCT, PLT, NA, K, CL, CO2, BUN, CREATININE, GLUCOSE, INR, CALCIUM in the last 72 hours.  Invalid input(s): PT, 2   Discharge Medications:   Allergies as of 05/27/2019      Reactions   No Known Allergies       Medication List    STOP taking these medications   meloxicam 7.5 MG tablet Commonly known as: MOBIC   naproxen sodium 220 MG tablet Commonly known as: ALEVE     TAKE these medications   aspirin 81 MG chewable tablet Chew 1 tablet (81 mg total) by mouth 2 (two) times daily.   CLA PO Take 1,000 mg by mouth at bedtime. Conjugated linoleic acid   HYDROcodone-acetaminophen 5-325 MG tablet Commonly known as: NORCO/VICODIN Take 1-2 tablets by mouth every 6 (six) hours as needed for moderate pain (pain score 4-6).   MEGAVITE FRUITS & VEGGIES PO Take 4-6 tablets by mouth at bedtime. Take 2-3 capsules of the Fruit & 2-3 capsules of the Veggie   multivitamin with minerals Tabs tablet Take 3 tablets by mouth at bedtime. Catalyn Chewable by  Standard Process   sildenafil 20 MG tablet Commonly known as: REVATIO Take 20 mg by mouth daily as needed (erectile dysfunction).   tiZANidine 4 MG tablet Commonly known as: Zanaflex Take 1 tablet (4 mg total) by mouth every 6 (six) hours as needed.            Durable Medical Equipment  (From admission, onward)         Start     Ordered   05/26/19 1332  DME Walker rolling  Once    Question:  Patient needs a walker to treat with the following condition  Answer:  Primary osteoarthritis of left knee   05/26/19 1331   05/26/19 1332  DME 3 n 1  Once     05/26/19 1331   05/26/19 1332  DME Bedside commode  Once    Question:  Patient needs a bedside commode to treat with the following condition  Answer:  Primary osteoarthritis of left knee   05/26/19 1331          Diagnostic Studies: Dg Chest 2 View  Result Date: 05/21/2019 CLINICAL DATA:  Preop for knee replacement. EXAM: CHEST - 2 VIEW COMPARISON:  04/23/2013 FINDINGS: The cardiac silhouette is normal in size and configuration. Normal mediastinal and hilar contours. Clear lungs.  No pleural effusion or pneumothorax. Skeletal structures are intact. Left shoulder reverse prosthesis, partly imaged, appears well aligned. IMPRESSION: No active cardiopulmonary disease. Electronically Signed   By: Lajean Manes M.D.   On: 05/21/2019 13:39    Disposition: Discharge disposition: 01-Home or Self Care       Discharge Instructions    Call MD / Call 911   Complete by: As directed    If you experience chest pain or shortness of breath, CALL 911 and be transported to the hospital emergency room.  If you develope a fever above 101 F, pus (white drainage) or increased drainage or redness at the wound, or calf pain, call your surgeon's office.   Constipation Prevention   Complete by: As directed    Drink plenty of fluids.  Prune juice may be helpful.  You may use a stool softener, such as Colace (over the counter) 100 mg twice a day.  Use  MiraLax (over the counter) for constipation as needed.  Diet - low sodium heart healthy   Complete by: As directed    Discharge instructions   Complete by: As directed    INSTRUCTIONS AFTER JOINT REPLACEMENT   Remove items at home which could result in a fall. This includes throw rugs or furniture in walking pathways ICE to the affected joint every three hours while awake for 30 minutes at a time, for at least the first 3-5 days, and then as needed for pain and swelling.  Continue to use ice for pain and swelling. You may notice swelling that will progress down to the foot and ankle.  This is normal after surgery.  Elevate your leg when you are not up walking on it.   Continue to use the breathing machine you got in the hospital (incentive spirometer) which will help keep your temperature down.  It is common for your temperature to cycle up and down following surgery, especially at night when you are not up moving around and exerting yourself.  The breathing machine keeps your lungs expanded and your temperature down.   DIET:  As you were doing prior to hospitalization, we recommend a well-balanced diet.  DRESSING / WOUND CARE / SHOWERING  You may shower 3 days after surgery, but keep the wounds dry during showering.  You may use an occlusive plastic wrap (Press'n Seal for example), NO SOAKING/SUBMERGING IN THE BATHTUB.  If the bandage gets wet, change with a clean dry gauze.  If the incision gets wet, pat the wound dry with a clean towel.  ACTIVITY  Increase activity slowly as tolerated, but follow the weight bearing instructions below.   No driving for 6 weeks or until further direction given by your physician.  You cannot drive while taking narcotics.  No lifting or carrying greater than 10 lbs. until further directed by your surgeon. Avoid periods of inactivity such as sitting longer than an hour when not asleep. This helps prevent blood clots.  You may return to work once you are  authorized by your doctor.     WEIGHT BEARING   Weight bearing as tolerated with assist device (walker, cane, etc) as directed, use it as long as suggested by your surgeon or therapist, typically at least 4-6 weeks.   EXERCISES  Results after joint replacement surgery are often greatly improved when you follow the exercise, range of motion and muscle strengthening exercises prescribed by your doctor. Safety measures are also important to protect the joint from further injury. Any time any of these exercises cause you to have increased pain or swelling, decrease what you are doing until you are comfortable again and then slowly increase them. If you have problems or questions, call your caregiver or physical therapist for advice.   Rehabilitation is important following a joint replacement. After just a few days of immobilization, the muscles of the leg can become weakened and shrink (atrophy).  These exercises are designed to build up the tone and strength of the thigh and leg muscles and to improve motion. Often times heat used for twenty to thirty minutes before working out will loosen up your tissues and help with improving the range of motion but do not use heat for the first two weeks following surgery (sometimes heat can increase post-operative swelling).   These exercises can be done on a training (exercise) mat, on the floor, on a table or on a bed. Use whatever works the best and is most comfortable for you.    Use music or  television while you are exercising so that the exercises are a pleasant break in your day. This will make your life better with the exercises acting as a break in your routine that you can look forward to.   Perform all exercises about fifteen times, three times per day or as directed.  You should exercise both the operative leg and the other leg as well.   Exercises include:   Quad Sets - Tighten up the muscle on the front of the thigh (Quad) and hold for 5-10  seconds.   Straight Leg Raises - With your knee straight (if you were given a brace, keep it on), lift the leg to 60 degrees, hold for 3 seconds, and slowly lower the leg.  Perform this exercise against resistance later as your leg gets stronger.  Leg Slides: Lying on your back, slowly slide your foot toward your buttocks, bending your knee up off the floor (only go as far as is comfortable). Then slowly slide your foot back down until your leg is flat on the floor again.  Angel Wings: Lying on your back spread your legs to the side as far apart as you can without causing discomfort.  Hamstring Strength:  Lying on your back, push your heel against the floor with your leg straight by tightening up the muscles of your buttocks.  Repeat, but this time bend your knee to a comfortable angle, and push your heel against the floor.  You may put a pillow under the heel to make it more comfortable if necessary.   A rehabilitation program following joint replacement surgery can speed recovery and prevent re-injury in the future due to weakened muscles. Contact your doctor or a physical therapist for more information on knee rehabilitation.    CONSTIPATION  Constipation is defined medically as fewer than three stools per week and severe constipation as less than one stool per week.  Even if you have a regular bowel pattern at home, your normal regimen is likely to be disrupted due to multiple reasons following surgery.  Combination of anesthesia, postoperative narcotics, change in appetite and fluid intake all can affect your bowels.   YOU MUST use at least one of the following options; they are listed in order of increasing strength to get the job done.  They are all available over the counter, and you may need to use some, POSSIBLY even all of these options:    Drink plenty of fluids (prune juice may be helpful) and high fiber foods Colace 100 mg by mouth twice a day  Senokot for constipation as directed and  as needed Dulcolax (bisacodyl), take with full glass of water  Miralax (polyethylene glycol) once or twice a day as needed.  If you have tried all these things and are unable to have a bowel movement in the first 3-4 days after surgery call either your surgeon or your primary doctor.    If you experience loose stools or diarrhea, hold the medications until you stool forms back up.  If your symptoms do not get better within 1 week or if they get worse, check with your doctor.  If you experience "the worst abdominal pain ever" or develop nausea or vomiting, please contact the office immediately for further recommendations for treatment.   ITCHING:  If you experience itching with your medications, try taking only a single pain pill, or even half a pain pill at a time.  You can also use Benadryl over the counter for itching  or also to help with sleep.   TED HOSE STOCKINGS:  Use stockings on both legs until for at least 2 weeks or as directed by physician office. They may be removed at night for sleeping.  MEDICATIONS:  See your medication summary on the "After Visit Summary" that nursing will review with you.  You may have some home medications which will be placed on hold until you complete the course of blood thinner medication.  It is important for you to complete the blood thinner medication as prescribed.  PRECAUTIONS:  If you experience chest pain or shortness of breath - call 911 immediately for transfer to the hospital emergency department.   If you develop a fever greater that 101 F, purulent drainage from wound, increased redness or drainage from wound, foul odor from the wound/dressing, or calf pain - CONTACT YOUR SURGEON.                                                   FOLLOW-UP APPOINTMENTS:  If you do not already have a post-op appointment, please call the office for an appointment to be seen by your surgeon.  Guidelines for how soon to be seen are listed in your "After Visit Summary",  but are typically between 1-4 weeks after surgery.  OTHER INSTRUCTIONS:   Knee Replacement:  Do not place pillow under knee, focus on keeping the knee straight while resting. CPM instructions: 0-90 degrees, 2 hours in the morning, 2 hours in the afternoon, and 2 hours in the evening. Place foam block, curve side up under heel at all times except when in CPM or when walking.  DO NOT modify, tear, cut, or change the foam block in any way.  MAKE SURE YOU:  Understand these instructions.  Get help right away if you are not doing well or get worse.    Thank you for letting us be a part of your medical care team.  It is a privilege we respect greatly.  We hope these instructions will help you stay on track for a fast and full recovery!   Increase activity slowly as tolerated   Complete by: As directed       Follow-up Information    Melrose Nakayama, MD. Schedule an appointment as soon as possible for a visit in 2 weeks.   Specialty: Orthopedic Surgery Contact information: [place] [address] [phone]            Signed: Larwance Sachs Olisa Quesnel 05/27/2019, 7:39 AM

## 2019-05-27 NOTE — Progress Notes (Signed)
Subjective: 1 Day Post-Op Procedure(s) (LRB): LEFT TOTAL KNEE ARTHROPLASTY (Left)   patient is doing well and has no real pain at this point. He is looking forward to going home today.  Activity level:  wbat Diet tolerance:  ok Voiding:  Foley out this morning Patient reports pain as mild.    Objective: Vital signs in last 24 hours: Temp:  [97.5 F (36.4 C)-98.5 F (36.9 C)] 97.6 F (36.4 C) (09/23 0450) Pulse Rate:  [61-82] 67 (09/23 0450) Resp:  [10-21] 16 (09/23 0450) BP: (94-161)/(62-98) 134/81 (09/23 0450) SpO2:  [93 %-99 %] 97 % (09/23 0450) Weight:  [106.8 kg] 106.8 kg (09/22 1354)  Labs: No results for input(s): HGB in the last 72 hours. No results for input(s): WBC, RBC, HCT, PLT in the last 72 hours. No results for input(s): NA, K, CL, CO2, BUN, CREATININE, GLUCOSE, CALCIUM in the last 72 hours. No results for input(s): LABPT, INR in the last 72 hours.  Physical Exam:  Neurologically intact ABD soft Neurovascular intact Sensation intact distally Intact pulses distally Dorsiflexion/Plantar flexion intact Incision: dressing C/D/I and no drainage No cellulitis present Compartment soft  Assessment/Plan:  1 Day Post-Op Procedure(s) (LRB): LEFT TOTAL KNEE ARTHROPLASTY (Left) Advance diet Up with therapy D/C IV fluids Discharge home with home health after PT today if cleared and doing well. Continue on 81mg  ASA BID x 2 weeks  Follow up in office 2 weeks post op.    Anthony Kane 05/27/2019, 7:35 AM

## 2019-05-27 NOTE — TOC Transition Note (Signed)
Transition of Care [facility]) - CM/SW Discharge Note   Patient Details  Name: Anthony Kane MRN: [id number] Date of Birth: 1951-03-31  Transition of [facility]) CM/SW Contact:  Lia Hopping, [place] Phone Number: 05/27/2019, 10:38 AM   Clinical Narrative:    Home Health orders received. CSW provided list of Home Health agencies. Patient prefers [facility] in network with insurance.  CSW reached out to [facility]. [facility] agreeable to provide therapy.  Patient notified.  Patient decline RW and 3 IN 1. Patient agreeable to crutches.    Final next level of care: [place] Barriers to Discharge: No Barriers Identified   Patient Goals and CMS Choice Patient states their goals for this hospitalization and ongoing recovery are:: to get better CMS Medicare.gov Compare Post Acute Care list provided to:: Patient Choice offered to / list presented to : Patient  Discharge Placement                       Discharge Plan and Services                          HH Arranged: PT [facility] Agency: [facility] [employer] Date [place]: 05/27/19 Time [place]: 1038 Representative spoke with at [place]: [place] ([place]) Interventions     Readmission Risk Interventions No flowsheet data found.

## 2019-06-10 ENCOUNTER — Other Ambulatory Visit: Payer: Self-pay

## 2019-06-10 ENCOUNTER — Encounter: Payer: Self-pay | Admitting: Physical Therapy

## 2019-06-10 ENCOUNTER — Ambulatory Visit: Payer: BC Managed Care – PPO | Attending: Orthopaedic Surgery | Admitting: Physical Therapy

## 2019-06-10 DIAGNOSIS — M25662 Stiffness of left knee, not elsewhere classified: Secondary | ICD-10-CM

## 2019-06-10 DIAGNOSIS — R262 Difficulty in walking, not elsewhere classified: Secondary | ICD-10-CM

## 2019-06-10 DIAGNOSIS — M6281 Muscle weakness (generalized): Secondary | ICD-10-CM | POA: Diagnosis present

## 2019-06-10 DIAGNOSIS — M25562 Pain in left knee: Secondary | ICD-10-CM

## 2019-06-10 NOTE — Therapy (Addendum)
[facility] [place] [address]  [place], [place], [address] Phone: [phone]   Fax:  [phone]  Physical Therapy Evaluation  Patient Details  Name: Anthony Kane MRN: [id number] Date of Birth: 06-04-1951 Referring Provider (PT): Melrose Nakayama, MD   Encounter Date: 06/10/2019  PT End of Session - 06/10/19 1222    Visit Number  1    Number of Visits  12    Date for PT Re-Evaluation  07/08/19    Authorization Type  BCBS & Medicare    PT Start Time  1055    PT Stop Time  1148    PT Time Calculation (min)  53 min    Activity Tolerance  Patient tolerated treatment well    Behavior During Therapy  [facility] for tasks assessed/performed       Past Medical History:  Diagnosis Date  . Allergy   . Arthritis    Shoulder and thumb  . Cataract   . History of blood transfusion 1983  . Prediabetes    i was but i changed my diet and had a another check and they did say anything about it , denies     Past Surgical History:  Procedure Laterality Date  . 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 arthroscopy of knee Bilateral    2 x each knee  . bunion surgery Right   . COLONOSCOPY    . EYE SURGERY Bilateral    intraocular lens- cataract  . HAMMER TOE SURGERY Right   . HAND SURGERY Right    x 9  . JOINT REPLACEMENT Right 2011 or 2010   knee  . SHOULDER OPEN ROTATOR CUFF REPAIR Right 05/08/2013   Procedure: RIGHT SHOULDER OPEN ROTATOR CUFF REPAIR with ANCHORS;  Surgeon: Tobi Bastos, MD;  Location: [facility];  Service: Orthopedics;  Laterality: Right;  . TONSILLECTOMY  age 86  . TOTAL KNEE ARTHROPLASTY Left 05/26/2019   Procedure: LEFT TOTAL KNEE ARTHROPLASTY;  Surgeon: Melrose Nakayama, MD;  Location: [facility];  Service: Orthopedics;  Laterality: Left;  . TOTAL SHOULDER ARTHROPLASTY Left 08/17/2016   Procedure: left reverse shoulder arthroplasty;  Surgeon: Netta Cedars, MD;  Location: [place];  Service: Orthopedics;   Laterality: Left;    There were no vitals filed for this visit.   Subjective Assessment - 06/10/19 1104    Subjective  Pt 15 days s/p L TKA on 05/26/19. Received HH PT x 4 visits. Self-weaned from AD ~1 wks ago. Also self-weaned from prescription pain meds to OTC meds with pain well controlled most days. Biggest concern is swelling but not wearing support hose as prescribed.    Pertinent History  L TKA 05/26/19    Limitations  Standing;Walking;Sitting    How long can you sit comfortably?  a few hours with adjustment of knee position every 15 minutes    How long can you stand comfortably?  5 minutes    How long can you walk comfortably?  10 minutes    Patient Stated Goals  "better balance and flexibility, and able to get the swelling to subside"    Currently in Pain?  Yes    Pain Score  1    3/10 at worst   Pain Location  Knee   plus pain in thigh at site or tourniquet and pain in lower calf   Pain Orientation  Left;Lateral    Pain Descriptors / Indicators  Discomfort    Pain Type  Surgical pain;Acute pain    Pain Frequency  Intermittent    Aggravating Factors   inactivity and elevation    Pain Relieving Factors  OTC pain meds    Effect of Pain on Daily Activities  less endurance for standing         [facility] PT Assessment - 06/10/19 1055      Assessment   Medical Diagnosis  L TKA    Referring Provider (PT)  Melrose Nakayama, MD    Onset Date/Surgical Date  05/26/19    Next MD Visit  06/24/19    Prior Therapy  HH PT x 4 visits      Precautions   Precautions  None      Balance Screen   Has the patient fallen in the past 6 months  No    Has the patient had a decrease in activity level because of a fear of falling?   No    Is the patient reluctant to leave their home because of a fear of falling?   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    Leisure  lift weights with son; run laps on track & run hills 2-3 days/wk      Cognition   Overall Cognitive Status  Within Functional Limits for tasks assessed      Observation/Other Assessments   Focus on Therapeutic Outcomes (FOTO)   Knee - 59% (41% limitation); Predicted 74% (26% limitation)      ROM / Strength   AROM / PROM / Strength  AROM;PROM;Strength      AROM   AROM Assessment Site  Knee    Right/Left Knee  Left;Right    Right Knee Extension  0    Right Knee Flexion  127    Left Knee Extension  7   24 dg in LAQ   Left Knee Flexion  108      PROM   PROM Assessment Site  Knee    Right/Left Knee  Left    Left Knee Flexion  114      Strength   Strength Assessment Site  Hip;Knee    Right/Left Hip  Right;Left    Right Hip Flexion  5/5    Right Hip Extension  5/5    Right Hip ABduction  5/5    Right Hip ADduction  4+/5    Left Hip Flexion  3+/5    Left Hip Extension  4-/5    Left Hip ABduction  4/5    Left Hip ADduction  3+/5    Right/Left Knee  Right;Left    Right Knee Flexion  5/5    Right Knee Extension  5/5    Left Knee Flexion  4/5    Left Knee Extension  4/5      Ambulation/Gait   Assistive device  None    Gait Pattern  Step-through pattern;Decreased weight shift to left;Decreased stance time - left;Antalgic    Ambulation Surface  Level;Indoor  Objective measurements completed on examination: See above findings.      [facility] Adult PT Treatment/Exercise - 06/10/19 1055      Exercises   Exercises  Knee/Hip      Knee/Hip Exercises: Stretches   Other Knee/Hip Stretches  provided instruction in propped knee extension stretch      Knee/Hip Exercises: Supine   Short Arc Quad Sets  Left;10  reps;Strengthening    Straight Leg Raises  Left;10 reps;Strengthening    Straight Leg Raises Limitations  cues for quad set prior to initiation of lift      Knee/Hip Exercises: Prone   Prone Knee Hang Limitations  provided instruction in positioning for prone hang extension stretch             PT Education - 06/10/19 1148    Education Details  PT eval findings, anticipated POC, precautions for knee positioning to decrease risk for flexion contracture & initial HEP    Person(s) Educated  Patient    Methods  Explanation;Demonstration;Handout;Verbal cues;Tactile cues    Comprehension  Verbalized understanding;Returned demonstration;Verbal cues required;Tactile cues required;Need further instruction          PT Long Term Goals - 06/10/19 1148      PT LONG TERM GOAL #1   Title  Independent with ongoing HEP and /or gym program    Status  New    Target Date  07/08/19      PT LONG TERM GOAL #2   Title  L knee AROM >/= 0-120 dg to allow for normal gait and stair mechanics    Status  New    Target Date  07/08/19      PT LONG TERM GOAL #3   Title  L LE strength >/= 4+/5 from improved stability    Status  New    Target Date  07/08/19      PT LONG TERM GOAL #4   Title  Patient will ambulate with normal gait pattern and negotiate stairs reciprocally with good step    Status  New    Target Date  07/08/19      PT LONG TERM GOAL #5   Title  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 He arrives to PT ambulating without AD having self-weaned ~1 week ago. Primary complaints include L knee pain and stiffness, L medial thigh and distal calf pain, along with L LE edema (he has stopped wearing support hose). He demonstrates  a mildly antalgic gait pattern with decreased L hip and knee flexion and decreased weight shift to L. Patient today with limited and painful L knee ROM, decreased strength, decreased incisional scar mobility, edema and gait deviations. Deficits limit activity and walking tolerance, require frequent change of position and interfere with sleep. 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    Personal Factors and Comorbidities  Comorbidity 3+  Comorbidities  R TKA; L TSA; R RTC repair    Examination-Activity Limitations  Locomotion Level;Stand    Examination-Participation Restrictions  Community Activity    Stability/Clinical Decision Making  Stable/Uncomplicated    Clinical Decision Making  Low    Rehab Potential  Excellent    PT Frequency  3x / week    PT Duration  4 weeks    PT Treatment/Interventions  ADLs/Self Care Home Management;Cryotherapy;Electrical Stimulation;Gait training;Stair training;Functional mobility training;Therapeutic activities;Therapeutic exercise;Balance training;Neuromuscular re-education;Patient/family education;Manual techniques;Scar mobilization;Passive range of motion;Dry needling;Taping;Joint Manipulations    PT Next Visit Plan  L knee ROM focsusing on extension; L LE strengthening; manual therapy and modalities PRN    Consulted and Agree with Plan of Care  Patient       Patient will benefit from skilled therapeutic intervention in order to improve the following deficits and impairments:  Abnormal gait, Decreased activity tolerance, Decreased balance, Decreased endurance, Decreased knowledge of precautions, Decreased mobility, Decreased range of motion, Decreased scar mobility, Decreased strength, Difficulty walking,  Increased edema, Increased fascial restricitons, Increased muscle spasms, Impaired flexibility, Improper body mechanics, Postural dysfunction, Pain  Visit Diagnosis: Stiffness of left knee, not elsewhere classified - Plan: PT plan of care cert/re-cert  Acute pain of left knee - Plan: PT plan of care cert/re-cert  Muscle weakness (generalized) - Plan: PT plan of care cert/re-cert  Difficulty in walking, not elsewhere classified - Plan: PT plan of care cert/re-cert     Problem List Patient Active Problem List   Diagnosis Date Noted  . Primary osteoarthritis of left knee 05/26/2019  . 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 Complete tear of rotator cuff 05/08/2013    Anthony Kane, PT, MPT 06/10/2019, 1:29 PM  [facility] [address]  [address] [place], [place], [address] Phone: [phone]   Fax:  [phone]  Name: Anthony Kane MRN: [id number] Date of Birth: 1950/09/22

## 2019-06-10 NOTE — Patient Instructions (Addendum)
    Home exercise program created by Kirandeep Fariss, PT.  For questions, please contact Tyrianna Lightle via phone at [phone] or email at [email or web address]  [facility] [place] [facility] [address]  [address] [place], [place], [address] Phone: [phone]   Fax:  [phone]    

## 2019-06-12 ENCOUNTER — Other Ambulatory Visit: Payer: Self-pay

## 2019-06-12 ENCOUNTER — Ambulatory Visit (HOSPITAL_COMMUNITY)
Admission: RE | Admit: 2019-06-12 | Discharge: 2019-06-12 | Disposition: A | Discharge status: Home / Self Care | Payer: BC Managed Care – PPO | Source: Ambulatory Visit | Attending: Orthopaedic Surgery | Admitting: Orthopaedic Surgery

## 2019-06-12 ENCOUNTER — Other Ambulatory Visit (HOSPITAL_COMMUNITY): Payer: Self-pay | Admitting: Orthopaedic Surgery

## 2019-06-12 DIAGNOSIS — M79605 Pain in left leg: Secondary | ICD-10-CM | POA: Diagnosis present

## 2019-06-12 DIAGNOSIS — M7989 Other specified soft tissue disorders: Secondary | ICD-10-CM | POA: Insufficient documentation

## 2019-06-12 NOTE — Progress Notes (Signed)
Lower extremity venous has been completed.   Preliminary results in CV Proc.   Attempted to call results, no answer.   Abram Sander 06/12/2019 1:01 PM

## 2019-06-15 ENCOUNTER — Ambulatory Visit: Payer: BC Managed Care – PPO | Admitting: Physical Therapy

## 2019-06-15 ENCOUNTER — Other Ambulatory Visit: Payer: Self-pay

## 2019-06-15 ENCOUNTER — Encounter: Payer: Self-pay | Admitting: Physical Therapy

## 2019-06-15 DIAGNOSIS — R262 Difficulty in walking, not elsewhere classified: Secondary | ICD-10-CM

## 2019-06-15 DIAGNOSIS — M25662 Stiffness of left knee, not elsewhere classified: Secondary | ICD-10-CM | POA: Diagnosis not present

## 2019-06-15 DIAGNOSIS — M6281 Muscle weakness (generalized): Secondary | ICD-10-CM

## 2019-06-15 DIAGNOSIS — M25562 Pain in left knee: Secondary | ICD-10-CM

## 2019-06-15 NOTE — Therapy (Signed)
[facility] [address]  [place], [place], [address] Phone: [phone]   Fax:  [phone]  Physical Therapy Treatment  Patient Details  Name: Anthony Kane MRN: [id number] Date of Birth: April 05, 1951 Referring Provider (PT): Melrose Nakayama, MD   Encounter Date: 06/15/2019  PT End of Session - 06/15/19 1313    Visit Number  2    Number of Visits  12    Date for PT Re-Evaluation  07/08/19    Authorization Type  BCBS & Medicare    PT Start Time  [id number]    PT Stop Time  1359    PT Time Calculation (min)  46 min    Activity Tolerance  Patient tolerated treatment well    Behavior During Therapy  [facility] for tasks assessed/performed       Past Medical History:  Diagnosis Date  . Allergy   . Arthritis    Shoulder and thumb  . Cataract   . History of blood transfusion 1983  . Prediabetes    i was but i changed my diet and had a another check and they did say anything about it , denies     Past Surgical History:  Procedure Laterality Date  . 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 arthroscopy of knee Bilateral    2 x each knee  . bunion surgery Right   . COLONOSCOPY    . EYE SURGERY Bilateral    intraocular lens- cataract  . HAMMER TOE SURGERY Right   . HAND SURGERY Right    x 9  . JOINT REPLACEMENT Right 2011 or 2010   knee  . SHOULDER OPEN ROTATOR CUFF REPAIR Right 05/08/2013   Procedure: RIGHT SHOULDER OPEN ROTATOR CUFF REPAIR with ANCHORS;  Surgeon: Tobi Bastos, MD;  Location: [facility];  Service: Orthopedics;  Laterality: Right;  . TONSILLECTOMY  age 67  . TOTAL KNEE ARTHROPLASTY Left 05/26/2019   Procedure: LEFT TOTAL KNEE ARTHROPLASTY;  Surgeon: Melrose Nakayama, MD;  Location: [facility];  Service: Orthopedics;  Laterality: Left;  . TOTAL SHOULDER ARTHROPLASTY Left 08/17/2016   Procedure: left reverse shoulder arthroplasty;  Surgeon: Netta Cedars, MD;  Location: [facility];  Service: Orthopedics;   Laterality: Left;    There were no vitals filed for this visit.  Subjective Assessment - 06/15/19 1315    Subjective  Pt reports he worked real hard on straightening the knee and feels that he is close to 0 dg.    Pertinent History  L TKA 05/26/19    Patient Stated Goals  "better balance and flexibility, and able to get the swelling to subside"    Currently in Pain?  Yes    Pain Score  3     Pain Location  Knee   & thigh   Pain Orientation  Left;Lateral    Pain Descriptors / Indicators  Discomfort    Pain Type  Surgical pain;Acute pain    Pain Frequency  Intermittent         [facility] PT Assessment - 06/15/19 1313      AROM   Left Knee Extension  4    Left Knee Flexion  128      PROM   Left Knee Extension  3                   [facility] Adult PT Treatment/Exercise - 06/15/19 1313      Exercises   Exercises  Knee/Hip      Knee/Hip Exercises: Aerobic   Recumbent Bike  L2 x 6 min      Knee/Hip Exercises: Machines for Strengthening   Cybex Knee Extension  B con/L ecc 15# x 12      Knee/Hip Exercises: Standing   Heel Raises  Both;15 reps;5 seconds    Heel Raises Limitations  + cues for quad set for full knee extension    Terminal Knee Extension  Left;15 reps;Strengthening;Theraband    Theraband Level (Terminal Knee Extension)  --   black   Terminal Knee Extension Limitations  cues for quad & glut set pushing heel down into floor      Knee/Hip Exercises: Supine   Short Arc Quad Sets  Left;15 reps;Strengthening    Short Arc Quad Sets Limitations  2#    Bridges with [employer]  Both;15 reps;Strengthening   5" hold   Straight Leg Raises  Left;10 reps;2 sets    Straight Leg Raises Limitations  cues to reset leg after each rep with new quad set prior to initiation of lift    Knee Flexion  Both;15 reps;Left;AAROM    Knee Flexion Limitations  HS curls with heels on peanut ball             PT Education - 06/15/19 1359    Education Details  HEP update - heel  raises, supported squats & TKE with black TB    Person(s) Educated  Patient    Methods  Explanation;Demonstration;Handout    Comprehension  Verbalized understanding;Returned demonstration;Need further instruction          PT Long Term Goals - 06/15/19 1318      PT LONG TERM GOAL #1   Title  Independent with ongoing HEP and /or gym program    Status  On-going    Target Date  07/08/19      PT LONG TERM GOAL #2   Title  L knee AROM >/= 0-120 dg to allow for normal gait and stair mechanics    Status  On-going    Target Date  07/08/19      PT LONG TERM GOAL #3   Title  L LE strength >/= 4+/5 from improved stability    Status  On-going    Target Date  07/08/19      PT LONG TERM GOAL #4   Title  Patient will ambulate with normal gait pattern and negotiate stairs reciprocally with good step    Status  On-going    Target Date  07/08/19      PT LONG TERM GOAL #5   Title  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 Improving quad activation noted with decreased quad lag during exercises however cues necessary to reset quad activation prior to subsequent leg lifts with SLR in supine. Introduced standing exercise targeting quad and proximal LE strengthening but cautioned patient not to overdo strengthening to reduce risk for increased inflammation in knee. Patient declining ice/vaso at end of session but encouraged to ice at home to minimize post-exercise pain and edema.    Comorbidities  R TKA; L  TSA; R RTC repair    Rehab Potential  Excellent    PT Frequency  3x / week    PT Duration  4 weeks    PT Treatment/Interventions  ADLs/Self Care Home Management;Cryotherapy;Electrical Stimulation;Gait  training;Stair training;Functional mobility training;Therapeutic activities;Therapeutic exercise;Balance training;Neuromuscular re-education;Patient/family education;Manual techniques;Scar mobilization;Passive range of motion;Dry needling;Taping;Joint Manipulations    PT Next Visit Plan  L knee ROM focsusing on extension; L LE strengthening; manual therapy and modalities PRN    PT Home Exercise Plan  06/10/19 - SLR, SAQ, extension stretches; 06/15/19 - heel raises, supported squats & TKE with black TB    Consulted and Agree with Plan of Care  Patient       Patient will benefit from skilled therapeutic intervention in order to improve the following deficits and impairments:  Abnormal gait, Decreased activity tolerance, Decreased balance, Decreased endurance, Decreased knowledge of precautions, Decreased mobility, Decreased range of motion, Decreased scar mobility, Decreased strength, Difficulty walking, Increased edema, Increased fascial restricitons, Increased muscle spasms, Impaired flexibility, Improper body mechanics, Postural dysfunction, Pain  Visit Diagnosis: Stiffness of left knee, not elsewhere classified  Acute pain of left knee  Muscle weakness (generalized)  Difficulty in walking, not elsewhere classified     Problem List Patient Active Problem List   Diagnosis Date Noted  . Primary osteoarthritis of left knee 05/26/2019  . 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 Complete tear of rotator cuff 05/08/2013    Percival Spanish, PT, MPT 06/15/2019, 3:12 PM  [facility] [address]  [address] [place], [place], [address] Phone: [phone]   Fax:  [phone]  Name: Anthony Kane MRN: [id number] Date of Birth: 1950/10/07

## 2019-06-15 NOTE — Patient Instructions (Signed)
    Home exercise program created by Tiny Rietz, PT.  For questions, please contact Ayahna Solazzo via phone at [phone] or email at [email or web address]  [facility] [place] [address]  [address] [place], [place], [address] Phone: [phone]   Fax:  [phone]    

## 2019-06-17 ENCOUNTER — Other Ambulatory Visit: Payer: Self-pay

## 2019-06-17 ENCOUNTER — Encounter: Payer: Self-pay | Admitting: Physical Therapy

## 2019-06-17 ENCOUNTER — Ambulatory Visit: Payer: BC Managed Care – PPO | Admitting: Physical Therapy

## 2019-06-17 DIAGNOSIS — M25662 Stiffness of left knee, not elsewhere classified: Secondary | ICD-10-CM

## 2019-06-17 DIAGNOSIS — M6281 Muscle weakness (generalized): Secondary | ICD-10-CM

## 2019-06-17 DIAGNOSIS — M25562 Pain in left knee: Secondary | ICD-10-CM

## 2019-06-17 DIAGNOSIS — R262 Difficulty in walking, not elsewhere classified: Secondary | ICD-10-CM

## 2019-06-17 NOTE — Therapy (Signed)
[facility] [place] [facility] [address]  [place], [place], [address] Phone: [phone]   Fax:  [phone]  Physical Therapy Treatment  Patient Details  Name: Anthony Kane MRN: [id number] Date of Birth: 09-26-1950 Referring Provider (PT): Melrose Nakayama, MD   Encounter Date: 06/17/2019  PT End of Session - 06/17/19 0934    Visit Number  3    Number of Visits  12    Date for PT Re-Evaluation  07/08/19    Authorization Type  BCBS & Medicare    PT Start Time  [id number]    PT Stop Time  1016    PT Time Calculation (min)  42 min    Activity Tolerance  Patient tolerated treatment well    Behavior During Therapy  [facility] for tasks assessed/performed       Past Medical History:  Diagnosis Date  . Allergy   . Arthritis    Shoulder and thumb  . Cataract   . History of blood transfusion 1983  . Prediabetes    i was but i changed my diet and had a another check and they did say anything about it , denies     Past Surgical History:  Procedure Laterality Date  . 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 arthroscopy of knee Bilateral    2 x each knee  . bunion surgery Right   . COLONOSCOPY    . EYE SURGERY Bilateral    intraocular lens- cataract  . HAMMER TOE SURGERY Right   . HAND SURGERY Right    x 9  . JOINT REPLACEMENT Right 2011 or 2010   knee  . SHOULDER OPEN ROTATOR CUFF REPAIR Right 05/08/2013   Procedure: RIGHT SHOULDER OPEN ROTATOR CUFF REPAIR with ANCHORS;  Surgeon: Tobi Bastos, MD;  Location: [facility];  Service: Orthopedics;  Laterality: Right;  . TONSILLECTOMY  age 68  . TOTAL KNEE ARTHROPLASTY Left 05/26/2019   Procedure: LEFT TOTAL KNEE ARTHROPLASTY;  Surgeon: Melrose Nakayama, MD;  Location: [facility];  Service: Orthopedics;  Laterality: Left;  . TOTAL SHOULDER ARTHROPLASTY Left 08/17/2016   Procedure: left reverse shoulder arthroplasty;  Surgeon: Netta Cedars, MD;  Location: [facility];  Service: Orthopedics;   Laterality: Left;    There were no vitals filed for this visit.  Subjective Assessment - 06/17/19 0938    Subjective  Pt reporting he knee swelled up after showering this morning. Still having some pain where the tourniquet was during surgery.    Pertinent History  L TKA 05/26/19    Patient Stated Goals  "better balance and flexibility, and able to get the swelling to subside"    Currently in Pain?  Yes    Pain Score  3     Pain Location  Knee   & thigh (2/10)   Pain Orientation  Left;Lateral    Pain Descriptors / Indicators  Discomfort    Pain Type  Surgical pain;Acute pain    Pain Frequency  Intermittent                       [facility] Adult PT Treatment/Exercise - 06/17/19 0934      Exercises   Exercises  Knee/Hip      Knee/Hip Exercises: Aerobic   Recumbent Bike  L2 x 6 min      Knee/Hip Exercises: Standing   Hip Flexion  Right;Left;10 reps;Knee straight;Stengthening    Hip Flexion Limitations  red  TB at ankle; 1 pole A for balance    Hip ADduction  Right;Left;10 reps;Strengthening    Hip ADduction Limitations  red TB at ankle; 1 pole A for balance    Hip Abduction  Right;Left;10 reps;Knee straight;Stengthening    Abduction Limitations  red TB at ankle; 1 pole A for balance    Hip Extension  Right;Left;10 reps;Knee straight;Stengthening    Extension Limitations  red TB at ankle; 1 pole A for balance    Lateral Step Up  Left;15 reps;Step Height: 8";Hand Hold: 1    Forward Step Up  Left;15 reps;Step Height: 8";Hand Hold: 1      Knee/Hip Exercises: Seated   Other Seated Knee/Hip Exercises  L Fitter leg press (2 black) x 10    Hamstring Curl  Left;15 reps;Strengthening    Hamstring Limitations  red TB      Manual Therapy   Manual Therapy  Soft tissue mobilization;Myofascial release;Other (comment)    Soft tissue mobilization  L mid/proximal lateral quad & ITB w/ an dw/o roller stick    Myofascial Release  pin & stretch to L lateral quad    Other Manual  Therapy  Instructed pt in use of roller stick for lateral quads & ITB.             PT Education - 06/17/19 1016    Education Details  HEP update - standing 4-way SLR with red TB; self-STM with rolling pin to quads/ITB    Person(s) Educated  Patient    Methods  Explanation;Demonstration;Handout    Comprehension  Verbalized understanding;Returned demonstration;Need further instruction          PT Long Term Goals - 06/15/19 1318      PT LONG TERM GOAL #1   Title  Independent with ongoing HEP and /or gym program    Status  On-going    Target Date  07/08/19      PT LONG TERM GOAL #2   Title  L knee AROM >/= 0-120 dg to allow for normal gait and stair mechanics    Status  On-going    Target Date  07/08/19      PT LONG TERM GOAL #3   Title  L LE strength >/= 4+/5 from improved stability    Status  On-going    Target Date  07/08/19      PT LONG TERM GOAL #4   Title  Patient will ambulate with normal gait pattern and negotiate stairs reciprocally with good step    Status  On-going    Target Date  07/08/19      PT LONG TERM GOAL #5   Title  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 Progressed exercises today targeting proximal strengthening while maintaining good quad activation and challenging proprioception for improved balance - HEP updated accordingly. He continues to c/o L lateral knee pain along with proximal anteriolateral thigh pain at level of tourniquet - increased muscle tension present in lateral quads and ITB with  particularly  taut band evident in mid/proximal VL - addressed this with manual STM/TPR and instructed patient in use of roller stick/rolling pin for self-STM at home, with patient noting benefit from ~2 of self-STM attempted in clinic. Patient again declining ice/vaso at end of session but noting increased swelling at home after standing for showering or being on feet more, therefore reinforced need for use of ice and elevation to minimize post-exercise/activity edema.    Comorbidities  R TKA; L TSA; R RTC repair    Rehab Potential  Excellent    PT Frequency  3x / week    PT Duration  4 weeks    PT Treatment/Interventions  ADLs/Self Care Home Management;Cryotherapy;Electrical Stimulation;Gait training;Stair training;Functional mobility training;Therapeutic activities;Therapeutic exercise;Balance training;Neuromuscular re-education;Patient/family education;Manual techniques;Scar mobilization;Passive range of motion;Dry needling;Taping;Joint Manipulations    PT Next Visit Plan  L knee ROM focsusing on extension; L LE strengthening; manual therapy and modalities PRN    PT Home Exercise Plan  06/10/19 - SLR, SAQ, extension stretches; 06/15/19 - heel raises, supported squats & TKE with black TB; 06/17/19 - standing 4-way SLR with red TB, self-STM with rolling pin to quads/ITB    Consulted and Agree with Plan of Care  Patient       Patient will benefit from skilled therapeutic intervention in order to improve the following deficits and impairments:  Abnormal gait, Decreased activity tolerance, Decreased balance, Decreased endurance, Decreased knowledge of precautions, Decreased mobility, Decreased range of motion, Decreased scar mobility, Decreased strength, Difficulty walking, Increased edema, Increased fascial restricitons, Increased muscle spasms, Impaired flexibility, Improper body mechanics, Postural dysfunction, Pain  Visit Diagnosis: Stiffness of left knee, not elsewhere classified  Acute pain of left  knee  Muscle weakness (generalized)  Difficulty in walking, not elsewhere classified     Problem List Patient Active Problem List   Diagnosis Date Noted  . Primary osteoarthritis of left knee 05/26/2019  . 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 Complete tear of rotator cuff 05/08/2013    Percival Spanish, PT, MPT 06/17/2019, 12:57 PM  [facility] [address]  [place], [place], [address] Phone: [phone]   Fax:  [phone]  Name: OC LEMMEN MRN: [id number] Date of Birth: 1950-12-14

## 2019-06-17 NOTE — Patient Instructions (Signed)
    Home exercise program created by JoAnne Kreis, PT.  For questions, please contact JoAnne via phone at [phone] or email at [email or web address]  [facility] [place] [facility] [address]  [address] [place], [place], [address] Phone: [phone]   Fax:  [phone]    

## 2019-06-19 ENCOUNTER — Other Ambulatory Visit: Payer: Self-pay

## 2019-06-19 ENCOUNTER — Ambulatory Visit: Payer: BC Managed Care – PPO

## 2019-06-19 DIAGNOSIS — R262 Difficulty in walking, not elsewhere classified: Secondary | ICD-10-CM

## 2019-06-19 DIAGNOSIS — M6281 Muscle weakness (generalized): Secondary | ICD-10-CM

## 2019-06-19 DIAGNOSIS — M25662 Stiffness of left knee, not elsewhere classified: Secondary | ICD-10-CM

## 2019-06-19 DIAGNOSIS — M25562 Pain in left knee: Secondary | ICD-10-CM

## 2019-06-19 NOTE — Therapy (Signed)
[facility] [address]  [place], [place], [address] Phone: [phone]   Fax:  [phone]  Physical Therapy Treatment  Patient Details  Name: Anthony Kane MRN: [id number] Date of Birth: 17-Feb-1951 Referring Provider (PT): Melrose Nakayama, MD   Encounter Date: 06/19/2019  PT End of Session - 06/19/19 0936    Visit Number  4    Number of Visits  12    Date for PT Re-Evaluation  07/08/19    Authorization Type  BCBS & Medicare    PT Start Time  0931    PT Stop Time  1025    PT Time Calculation (min)  54 min    Activity Tolerance  Patient tolerated treatment well    Behavior During Therapy  [facility] for tasks assessed/performed       Past Medical History:  Diagnosis Date  . Allergy   . Arthritis    Shoulder and thumb  . Cataract   . History of blood transfusion 1983  . Prediabetes    i was but i changed my diet and had a another check and they did say anything about it , denies     Past Surgical History:  Procedure Laterality Date  . 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 arthroscopy of knee Bilateral    2 x each knee  . bunion surgery Right   . COLONOSCOPY    . EYE SURGERY Bilateral    intraocular lens- cataract  . HAMMER TOE SURGERY Right   . HAND SURGERY Right    x 9  . JOINT REPLACEMENT Right 2011 or 2010   knee  . SHOULDER OPEN ROTATOR CUFF REPAIR Right 05/08/2013   Procedure: RIGHT SHOULDER OPEN ROTATOR CUFF REPAIR with ANCHORS;  Surgeon: Tobi Bastos, MD;  Location: [facility];  Service: Orthopedics;  Laterality: Right;  . TONSILLECTOMY  age 82  . TOTAL KNEE ARTHROPLASTY Left 05/26/2019   Procedure: LEFT TOTAL KNEE ARTHROPLASTY;  Surgeon: Melrose Nakayama, MD;  Location: [facility];  Service: Orthopedics;  Laterality: Left;  . TOTAL SHOULDER ARTHROPLASTY Left 08/17/2016   Procedure: left reverse shoulder arthroplasty;  Surgeon: Netta Cedars, MD;  Location: [facility];  Service: Orthopedics;   Laterality: Left;    There were no vitals filed for this visit.  Subjective Assessment - 06/19/19 0935    Subjective  Pt. noting he needs to keep his ace wrap on his L knee to keep swelling down.    Pertinent History  L TKA 05/26/19    Patient Stated Goals  "better balance and flexibility, and able to get the swelling to subside"    Currently in Pain?  Yes    Pain Score  --   2.5/10   Pain Location  Knee    Pain Orientation  Left;Lateral    Pain Descriptors / Indicators  Discomfort    Pain Type  Surgical pain;Acute pain    Pain Onset  1 to 4 weeks ago    Pain Frequency  Intermittent    Multiple Pain Sites  No                       [facility] Adult PT Treatment/Exercise - 06/19/19 0001      Self-Care   Self-Care  Scar Mobilizations    Scar Mobilizations  Instruction in proper technique for scar mobilization       Knee/Hip Exercises: Public affairs consultant  Left;1 rep;60 seconds    Quad Stretch Limitations  strap + bolster      Knee/Hip Exercises: Aerobic   Recumbent Bike  L2 x 6 min      Knee/Hip Exercises: Machines for Strengthening   Cybex Knee Extension  B con/L ecc 20# x 10    Cybex Knee Flexion  B LE's: 25# x 15 reps    Cybex Leg Press  B LE's: 35# x 15 reps       Knee/Hip Exercises: Supine   Quad Sets  Left;15 reps    Quad Sets Limitations  5" hold - + heel prop    Straight Leg Raises  Left;15 reps    Straight Leg Raises Limitations  Cues for quad set prior to each rep      Knee/Hip Exercises: Sidelying   Hip ABduction  Left;10 reps    Hip ADduction  Left;10 reps      Vasopneumatic   Number Minutes Vasopneumatic   10 minutes    Vasopnuematic Location   Knee    Vasopneumatic Pressure  Low    Vasopneumatic Temperature   coldest temp.      Manual Therapy   Manual Therapy  Soft tissue mobilization    Soft tissue mobilization  L knee scar massage                   PT Long Term Goals - 06/15/19 1318      PT LONG TERM GOAL #1    Title  Independent with ongoing HEP and /or gym program    Status  On-going    Target Date  07/08/19      PT LONG TERM GOAL #2   Title  L knee AROM >/= 0-120 dg to allow for normal gait and stair mechanics    Status  On-going    Target Date  07/08/19      PT LONG TERM GOAL #3   Title  L LE strength >/= 4+/5 from improved stability    Status  On-going    Target Date  07/08/19      PT LONG TERM GOAL #4   Title  Patient will ambulate with normal gait pattern and negotiate stairs reciprocally with good step    Status  On-going    Target Date  07/08/19      PT LONG TERM GOAL #5   Title  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 denies soreness after last session.  Session focused on extension-based strengthening and machine strengthening activities today as pt. wishing to return to LE machine-based strengthening following rehab.  Anthony Kane instructed on proper technique with scar massage and avoided mid incision as this area is not yet well healed with pt. verbalizing understanding.  Ended visit with application of ice/compression to L knee to reduce post-exercise swelling and pain.    Personal Factors and Comorbidities  Comorbidity 3+    Comorbidities  R TKA; L TSA; R RTC repair    Rehab Potential  Excellent    PT Treatment/Interventions  ADLs/Self Care Home Management;Cryotherapy;Electrical Stimulation;Gait training;Stair training;Functional mobility training;Therapeutic activities;Therapeutic exercise;Balance training;Neuromuscular re-education;Patient/family education;Manual techniques;Scar mobilization;Passive range of motion;Dry needling;Taping;Joint Manipulations    PT Next Visit Plan  L knee ROM focsusing on extension; L LE strengthening; manual therapy and modalities PRN    PT Home Exercise  Plan  06/10/19 - SLR, SAQ, extension stretches; 06/15/19 - heel raises, supported squats & TKE with black TB; 06/17/19 - standing 4-way SLR with red TB, self-STM with rolling pin to quads/ITB    Consulted and Agree with Plan of Care  Patient       Patient will benefit from skilled therapeutic intervention in order to improve the following deficits and impairments:  Abnormal gait, Decreased activity tolerance, Decreased balance, Decreased endurance, Decreased knowledge of precautions, Decreased mobility, Decreased range of motion, Decreased scar mobility, Decreased strength, Difficulty walking, Increased edema, Increased fascial restricitons, Increased muscle spasms, Impaired flexibility, Improper body mechanics, Postural dysfunction, Pain  Visit Diagnosis: Stiffness of left knee, not elsewhere classified  Acute pain of left knee  Muscle weakness (generalized)  Difficulty in walking, not elsewhere classified     Problem List Patient Active Problem List   Diagnosis Date Noted  . Primary osteoarthritis of left knee 05/26/2019  . 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 Complete tear of rotator cuff 05/08/2013    Bess Harvest, PTA 06/19/19 1:35 PM   [facility] [address]  [place], [place], [address] Phone: [phone]   Fax:  [phone]  Name: Anthony Kane MRN: [id number] Date of Birth: 1951-03-29

## 2019-06-22 ENCOUNTER — Other Ambulatory Visit: Payer: Self-pay

## 2019-06-22 ENCOUNTER — Ambulatory Visit: Payer: BC Managed Care – PPO

## 2019-06-22 DIAGNOSIS — M25662 Stiffness of left knee, not elsewhere classified: Secondary | ICD-10-CM | POA: Diagnosis not present

## 2019-06-22 DIAGNOSIS — M6281 Muscle weakness (generalized): Secondary | ICD-10-CM

## 2019-06-22 DIAGNOSIS — R262 Difficulty in walking, not elsewhere classified: Secondary | ICD-10-CM

## 2019-06-22 DIAGNOSIS — M25562 Pain in left knee: Secondary | ICD-10-CM

## 2019-06-22 NOTE — Therapy (Signed)
[facility] [address]  [place], [place], [address] Phone: [phone]   Fax:  [phone]  Physical Therapy Treatment  Patient Details  Name: Anthony Kane MRN: [id number] Date of Birth: Mar 20, 1951 Referring Provider (PT): Melrose Nakayama, MD   Encounter Date: 06/22/2019  PT End of Session - 06/22/19 0937    Visit Number  5    Number of Visits  12    Date for PT Re-Evaluation  07/08/19    Authorization Type  BCBS & Medicare    PT Start Time  [phone]    PT Stop Time  1018    PT Time Calculation (min)  47 min    Activity Tolerance  Patient tolerated treatment well    Behavior During Therapy  [facility] for tasks assessed/performed       Past Medical History:  Diagnosis Date  . Allergy   . Arthritis    Shoulder and thumb  . Cataract   . History of blood transfusion 1983  . Prediabetes    i was but i changed my diet and had a another check and they did say anything about it , denies     Past Surgical History:  Procedure Laterality Date  . 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 arthroscopy of knee Bilateral    2 x each knee  . bunion surgery Right   . COLONOSCOPY    . EYE SURGERY Bilateral    intraocular lens- cataract  . HAMMER TOE SURGERY Right   . HAND SURGERY Right    x 9  . JOINT REPLACEMENT Right 2011 or 2010   knee  . SHOULDER OPEN ROTATOR CUFF REPAIR Right 05/08/2013   Procedure: RIGHT SHOULDER OPEN ROTATOR CUFF REPAIR with ANCHORS;  Surgeon: Tobi Bastos, MD;  Location: [facility];  Service: Orthopedics;  Laterality: Right;  . TONSILLECTOMY  age 43  . TOTAL KNEE ARTHROPLASTY Left 05/26/2019   Procedure: LEFT TOTAL KNEE ARTHROPLASTY;  Surgeon: Melrose Nakayama, MD;  Location: [facility];  Service: Orthopedics;  Laterality: Left;  . TOTAL SHOULDER ARTHROPLASTY Left 08/17/2016   Procedure: left reverse shoulder arthroplasty;  Surgeon: Netta Cedars, MD;  Location: [facility];  Service: Orthopedics;   Laterality: Left;    There were no vitals filed for this visit.  Subjective Assessment - 06/22/19 0934    Subjective  Pt. reporting discomfort and swelling after prolonged walking with wife during shopping over weekend.    Pertinent History  L TKA 05/26/19    Patient Stated Goals  "better balance and flexibility, and able to get the swelling to subside"    Currently in Pain?  Yes    Pain Score  2     Pain Location  Knee    Pain Orientation  Left;Lateral;Medial;Posterior;Anterior    Pain Descriptors / Indicators  Pressure    Pain Type  Acute pain;Surgical pain    Aggravating Factors   Lots of walking over weekend    Multiple Pain Sites  No         [facility] PT Assessment - 06/22/19 0001      Assessment   Medical Diagnosis  L TKA    Referring Provider (PT)  Melrose Nakayama, MD    Onset Date/Surgical Date  05/26/19    Next MD Visit  06/24/19    Prior Therapy  [facility] PT x 4 visits      AROM   AROM Assessment Site  Knee  Right/Left Knee  Left    Left Knee Extension  3    Left Knee Flexion  128      PROM   PROM Assessment Site  Knee    Right/Left Knee  Left    Left Knee Extension  2    Left Knee Flexion  132      Strength   Strength Assessment Site  Hip;Knee    Right/Left Hip  Right;Left    Right Hip Flexion  5/5    Right Hip Extension  5/5    Right Hip ABduction  5/5    Right Hip ADduction  4+/5    Left Hip Flexion  4+/5    Left Hip Extension  4/5    Left Hip ABduction  4+/5    Left Hip ADduction  4/5    Right/Left Knee  Right;Left    Right Knee Flexion  5/5    Right Knee Extension  5/5    Left Knee Flexion  4+/5    Left Knee Extension  4+/5                   [facility] Adult PT Treatment/Exercise - 06/22/19 0001      Ambulation/Gait   Ambulation/Gait  Yes    Ambulation/Gait Assistance  5: Supervision    Ambulation/Gait Assistance Details  Pt. ambulating with mild lack of heel strike on L LE and trunk lean forwards     Ambulation Distance (Feet)  90 Feet     Assistive device  None    Stairs  Yes    Stairs Assistance  6: Modified independent (Device/Increase time)    Stair Management Technique  One rail Right;Alternating pattern    Number of Stairs  14    Height of Stairs  7    Gait Comments  Encouraged pt. to ambulate with upright posture and B heel strike - excellent carryover; pt. able to ascend/descend stairs x 14 with light rail use and only min L quad weakness noted       Self-Care   Self-Care  Other Self-Care Comments    Other Self-Care Comments   Instructed pt. in proper seated LLLD L knee extension stretching       Knee/Hip Exercises: Stretches   Sports administrator  Left;1 rep;60 seconds    Quad Stretch Limitations  strap + bolster    ITB Stretch  Left;1 rep;30 seconds    ITB Stretch Limitations  strap     Piriformis Stretch  Left;1 rep;30 seconds    Piriformis Stretch Limitations  KTOS    Gastroc Stretch  Left;1 rep;30 seconds    Gastroc Stretch Limitations  into counter       Knee/Hip Exercises: Aerobic   Recumbent Bike  L2 x 6 min      Knee/Hip Exercises: Standing   Step Down  Left;10 reps;Hand Hold: 1    Step Down Limitations  7" eccentric step-down     SLS  L SLS x 15 sec at counter ; no UE - no difficulty     SLS with Vectors  L SLS "clocks" 3 x 15 sec - no UE support however at counter             PT Education - 06/22/19 1254    Education Details  HEP update; L SLS "clocks"    Person(s) Educated  Patient    Methods  Explanation;Demonstration;Verbal cues;Handout    Comprehension  Verbalized understanding;Returned demonstration;Verbal cues required  PT Long Term Goals - 06/22/19 1216      PT LONG TERM GOAL #1   Title  Independent with ongoing HEP and /or gym program    Status  Partially Met      PT LONG TERM GOAL #2   Title  L knee AROM >/= 0-120 dg to allow for normal gait and stair mechanics    Status  Partially Met      PT LONG TERM GOAL #3   Title  L LE strength >/= 4+/5 from improved  stability    Status  Partially Met      PT LONG TERM GOAL #4   Title  Patient will ambulate with normal gait pattern and negotiate stairs reciprocally with good step    Status  Partially Met      PT LONG TERM GOAL #5   Title  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 has made excellent progress with physical therapy.  Able to demonstrate progress toward LTG #2, and #3 demonstrating improved L knee AROM 3-128 dg today and much improved L LE strength with MMT achieved with exception of a half grade of weakness remaining in L hip adduction, extension 4/5 strength with MMT.  Able to demonstrate improved L quad functional strength descending stairs today with reciprocal step-over-step pattern and light rail use.  Gait pattern normalizing only requiring occasional cueing for upright posture and B heel strike.  Pt. progressing toward LTG #4.  Progressed single leg stance balance activities today on L LE as pt. 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  Session today addressing some ITB/quad discomfort with LE stretching which pt. attributes to prolonged walking with wife over weekend with shopping.  HEP updated with SLS balance activity.  Will continue to progress toward remaining LTGs.    Personal Factors and Comorbidities  Comorbidity 3+    Comorbidities  R TKA; L TSA; R RTC repair    Rehab Potential  Excellent    PT Treatment/Interventions  ADLs/Self Care Home Management;Cryotherapy;Electrical Stimulation;Gait training;Stair training;Functional mobility training;Therapeutic activities;Therapeutic exercise;Balance training;Neuromuscular re-education;Patient/family education;Manual  techniques;Scar mobilization;Passive range of motion;Dry needling;Taping;Joint Manipulations    PT Next Visit Plan  L knee ROM focsusing on extension; L LE strengthening; manual therapy and modalities PRN    PT Home Exercise Plan  06/10/19 - SLR, SAQ, extension stretches; 06/15/19 - heel raises, supported squats & TKE with black TB; 06/17/19 - standing 4-way SLR with red TB, self-STM with rolling pin to quads/ITB;  06/22/19 - L SLS  "clocks"    Consulted and Agree with Plan of Care  Patient       Patient will benefit from skilled therapeutic intervention in order to improve the following deficits and impairments:  Abnormal gait, Decreased activity tolerance, Decreased balance, Decreased endurance, Decreased knowledge of precautions, Decreased mobility, Decreased range of motion, Decreased scar mobility, Decreased strength, Difficulty walking, Increased edema, Increased fascial restricitons, Increased muscle spasms, Impaired flexibility, Improper body mechanics, Postural dysfunction, Pain  Visit Diagnosis: Stiffness of left knee, not elsewhere classified  Acute pain of left knee  Muscle weakness (generalized)  Difficulty in walking, not elsewhere classified     Problem List Patient Active Problem List   Diagnosis Date Noted  . Primary osteoarthritis of left knee 05/26/2019  . 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 Complete tear of rotator cuff 05/08/2013    Bess Harvest, PTA 06/22/19 12:55 PM   [facility] [address]  [place], [place], [address] Phone: [phone]   Fax:  [phone]  Name: Anthony Kane MRN: [id number] Date of Birth: December 01, 1950

## 2019-06-24 ENCOUNTER — Ambulatory Visit: Payer: BC Managed Care – PPO | Admitting: Physical Therapy

## 2019-06-24 ENCOUNTER — Encounter: Payer: Self-pay | Admitting: Physical Therapy

## 2019-06-24 ENCOUNTER — Other Ambulatory Visit: Payer: Self-pay

## 2019-06-24 DIAGNOSIS — M25562 Pain in left knee: Secondary | ICD-10-CM

## 2019-06-24 DIAGNOSIS — M25662 Stiffness of left knee, not elsewhere classified: Secondary | ICD-10-CM | POA: Diagnosis not present

## 2019-06-24 DIAGNOSIS — M6281 Muscle weakness (generalized): Secondary | ICD-10-CM

## 2019-06-24 DIAGNOSIS — R262 Difficulty in walking, not elsewhere classified: Secondary | ICD-10-CM

## 2019-06-24 NOTE — Therapy (Signed)
[facility] [address]  [place], [place], [address] Phone: [phone]   Fax:  [phone]  Physical Therapy Treatment  Patient Details  Name: Anthony Kane MRN: [id number] Date of Birth: 06-28-1951 Referring Provider (PT): Melrose Nakayama, MD   Encounter Date: 06/24/2019  PT End of Session - 06/24/19 1147    Visit Number  6    Number of Visits  12    Date for PT Re-Evaluation  07/08/19    Authorization Type  BCBS & Medicare    PT Start Time  1147    PT Stop Time  1236    PT Time Calculation (min)  49 min    Activity Tolerance  Patient tolerated treatment well    Behavior During Therapy  [facility] for tasks assessed/performed       Past Medical History:  Diagnosis Date  . Allergy   . Arthritis    Shoulder and thumb  . Cataract   . History of blood transfusion 1983  . Prediabetes    i was but i changed my diet and had a another check and they did say anything about it , denies     Past Surgical History:  Procedure Laterality Date  . 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 arthroscopy of knee Bilateral    2 x each knee  . bunion surgery Right   . COLONOSCOPY    . EYE SURGERY Bilateral    intraocular lens- cataract  . HAMMER TOE SURGERY Right   . HAND SURGERY Right    x 9  . JOINT REPLACEMENT Right 2011 or 2010   knee  . SHOULDER OPEN ROTATOR CUFF REPAIR Right 05/08/2013   Procedure: RIGHT SHOULDER OPEN ROTATOR CUFF REPAIR with ANCHORS;  Surgeon: Tobi Bastos, MD;  Location: [facility];  Service: Orthopedics;  Laterality: Right;  . TONSILLECTOMY  age 68  . TOTAL KNEE ARTHROPLASTY Left 05/26/2019   Procedure: LEFT TOTAL KNEE ARTHROPLASTY;  Surgeon: Melrose Nakayama, MD;  Location: [facility];  Service: Orthopedics;  Laterality: Left;  . TOTAL SHOULDER ARTHROPLASTY Left 08/17/2016   Procedure: left reverse shoulder arthroplasty;  Surgeon: Netta Cedars, MD;  Location: [facility];  Service: Orthopedics;   Laterality: Left;    There were no vitals filed for this visit.  Subjective Assessment - 06/24/19 1150    Subjective  Pt reporting he thinks he overdid it working in the yard yesterday. Reports he is hoping to go back to work by Thanksgiving.    Pertinent History  L TKA 05/26/19    Patient Stated Goals  "better balance and flexibility, and able to get the swelling to subside"    Currently in Pain?  Yes    Pain Score  2    2.5/10   Pain Location  Knee    Pain Orientation  Left    Pain Descriptors / Indicators  Sore    Pain Type  Surgical pain;Acute pain    Pain Frequency  Intermittent         [facility] PT Assessment - 06/24/19 1147      Assessment   Medical Diagnosis  L TKA    Referring Provider (PT)  Melrose Nakayama, MD    Onset Date/Surgical Date  05/26/19    Next MD Visit  07/24/19                   [facility] Adult PT Treatment/Exercise - 06/24/19 1147  Exercises   Exercises  Knee/Hip      Knee/Hip Exercises: Stretches   Other Knee/Hip Stretches  Reviewed instruction in seated low load knee extension stretch with foot propped on chair.      Knee/Hip Exercises: Aerobic   Recumbent Bike  L2 x 6 min      Knee/Hip Exercises: Standing   Step Down  Left;10 reps;Step Height: 8";Hand Hold: 2;2 sets    Step Down Limitations  lateral & fwd eccentric lowering with light touch to floor    Functional Squat  10 reps;5 seconds;2 sets    Functional Squat Limitations  TRX - 2nd set with triple extension    SLS with Vectors  L SLS on blue foam oval + 4-way R toe tap to cones x10    Other Standing Knee Exercises  L/R SLS RDL with reach/touch to 8" step 8# x 5 each    Other Standing Knee Exercises  Tandem gait on foam balance beam to simulate narrow catwalk on top of tanker x 10 passes      Knee/Hip Exercises: Prone   Prone Knee Hang Limitations  Reviewed instruction in positioning for prone hang extension stretch                  PT Long Term Goals - 06/22/19  1216      PT LONG TERM GOAL #1   Title  Independent with ongoing HEP and /or gym program    Status  Partially Met      PT LONG TERM GOAL #2   Title  L knee AROM >/= 0-120 dg to allow for normal gait and stair mechanics    Status  Partially Met      PT LONG TERM GOAL #3   Title  L LE strength >/= 4+/5 from improved stability    Status  Partially Met      PT LONG TERM GOAL #4   Title  Patient will ambulate with normal gait pattern and negotiate stairs reciprocally with good step    Status  Partially Met      PT LONG TERM GOAL #5   Title  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 Patient noting increased soreness after busy day in the yard yesterday but somewhat better this morning. He stated he is hoping to be able to go back to work by Thanksgiving, therefore incorporated balance and strengthening exercises targeting improved stability with walking along the catwalk on top of the tanker as well as squatting/bending necessary to reach down and secure the tanker hatches. Mild to moderate instability observed with SLS stance activities but able to walk foam balance beam with only occasional step-off to regain balance. Intermittent rest breaks required throughout session due to fatigue/limited endurance. During rest breaks, reviewed low-load positional stretching with foot propped on chair and/or prone hangs to continue to target full extension ROM.    Personal Factors and Comorbidities  Comorbidity 3+    Comorbidities  R TKA; L TSA; R RTC repair    Rehab Potential  Excellent    PT Frequency  3x / week    PT Duration  4 weeks  PT Treatment/Interventions  ADLs/Self Care Home Management;Cryotherapy;Electrical  Stimulation;Gait training;Stair training;Functional mobility training;Therapeutic activities;Therapeutic exercise;Balance training;Neuromuscular re-education;Patient/family education;Manual techniques;Scar mobilization;Passive range of motion;Dry needling;Taping;Joint Manipulations    PT Next Visit Plan  L knee ROM focsusing on extension; L LE strengthening; manual therapy and modalities PRN    PT Home Exercise Plan  06/10/19 - SLR, SAQ, extension stretches; 06/15/19 - heel raises, supported squats & TKE with black TB; 06/17/19 - standing 4-way SLR with red TB, self-STM with rolling pin to quads/ITB;  06/22/19 - L SLS  "clocks"    Consulted and Agree with Plan of Care  Patient       Patient will benefit from skilled therapeutic intervention in order to improve the following deficits and impairments:  Abnormal gait, Decreased activity tolerance, Decreased balance, Decreased endurance, Decreased knowledge of precautions, Decreased mobility, Decreased range of motion, Decreased scar mobility, Decreased strength, Difficulty walking, Increased edema, Increased fascial restricitons, Increased muscle spasms, Impaired flexibility, Improper body mechanics, Postural dysfunction, Pain  Visit Diagnosis: Stiffness of left knee, not elsewhere classified  Acute pain of left knee  Muscle weakness (generalized)  Difficulty in walking, not elsewhere classified     Problem List Patient Active Problem List   Diagnosis Date Noted  . Primary osteoarthritis of left knee 05/26/2019  . 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 Complete tear of rotator cuff 05/08/2013    Percival Spanish, PT, MPT 06/24/2019, 12:50 PM  [facility] [address]  [place], [place], [address] Phone: [phone]   Fax:  [phone]  Name: SHANNEN VERNON MRN: [id number] Date of Birth: November 07, 1950

## 2019-06-26 ENCOUNTER — Other Ambulatory Visit: Payer: Self-pay

## 2019-06-26 ENCOUNTER — Encounter: Payer: Self-pay | Admitting: Physical Therapy

## 2019-06-26 ENCOUNTER — Ambulatory Visit: Payer: BC Managed Care – PPO | Admitting: Physical Therapy

## 2019-06-26 DIAGNOSIS — R262 Difficulty in walking, not elsewhere classified: Secondary | ICD-10-CM

## 2019-06-26 DIAGNOSIS — M6281 Muscle weakness (generalized): Secondary | ICD-10-CM

## 2019-06-26 DIAGNOSIS — M25562 Pain in left knee: Secondary | ICD-10-CM

## 2019-06-26 DIAGNOSIS — M25662 Stiffness of left knee, not elsewhere classified: Secondary | ICD-10-CM

## 2019-06-26 NOTE — Therapy (Signed)
[facility] [address]  [place], [place], [address] Phone: [phone]   Fax:  [phone]  Physical Therapy Treatment  Patient Details  Name: Anthony Kane MRN: [id number] Date of Birth: 1951-01-10 Referring Provider (PT): Melrose Nakayama, MD   Encounter Date: 06/26/2019  PT End of Session - 06/26/19 0932    Visit Number  7    Number of Visits  12    Date for PT Re-Evaluation  07/08/19    Authorization Type  BCBS & Medicare    PT Start Time  0932    PT Stop Time  1019    PT Time Calculation (min)  47 min    Activity Tolerance  Patient tolerated treatment well    Behavior During Therapy  [facility] for tasks assessed/performed       Past Medical History:  Diagnosis Date  . Allergy   . Arthritis    Shoulder and thumb  . Cataract   . History of blood transfusion 1983  . Prediabetes    i was but i changed my diet and had a another check and they did say anything about it , denies     Past Surgical History:  Procedure Laterality Date  . 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 arthroscopy of knee Bilateral    2 x each knee  . bunion surgery Right   . COLONOSCOPY    . EYE SURGERY Bilateral    intraocular lens- cataract  . HAMMER TOE SURGERY Right   . HAND SURGERY Right    x 9  . JOINT REPLACEMENT Right 2011 or 2010   knee  . SHOULDER OPEN ROTATOR CUFF REPAIR Right 05/08/2013   Procedure: RIGHT SHOULDER OPEN ROTATOR CUFF REPAIR with ANCHORS;  Surgeon: Tobi Bastos, MD;  Location: [facility];  Service: Orthopedics;  Laterality: Right;  . TONSILLECTOMY  age 68  . TOTAL KNEE ARTHROPLASTY Left 05/26/2019   Procedure: LEFT TOTAL KNEE ARTHROPLASTY;  Surgeon: Melrose Nakayama, MD;  Location: [facility];  Service: Orthopedics;  Laterality: Left;  . TOTAL SHOULDER ARTHROPLASTY Left 08/17/2016   Procedure: left reverse shoulder arthroplasty;  Surgeon: Netta Cedars, MD;  Location: [place];  Service: Orthopedics;   Laterality: Left;    There were no vitals filed for this visit.  Subjective Assessment - 06/26/19 0937    Subjective  Pt reporting knee "is virtually painless right now" but lower calf bothering him (1.5 - 2/10) as well as ~2/10 pain in thigh at level of surgical tourniquet.    Pertinent History  L TKA 05/26/19    Patient Stated Goals  "better balance and flexibility, and able to get the swelling to subside"    Currently in Pain?  Yes    Pain Score  2     Pain Location  Leg    Pain Orientation  Left    Pain Descriptors / Indicators  Sore                       [facility] Adult PT Treatment/Exercise - 06/26/19 0932      Knee/Hip Exercises: Stretches   Gastroc Stretch  Left;30 seconds;1 rep    Gastroc Stretch Limitations  runner's stretch at wall    Soleus Stretch  Left;30 seconds;1 rep    Soleus Stretch Limitations  runner's stretch at wall      Knee/Hip Exercises: Aerobic   Recumbent Bike  L2 x 6 min  Knee/Hip Exercises: Standing   Hip Flexion  Right;Left;10 reps;Knee straight;Stengthening    Hip Flexion Limitations  green TB at ankle + opp LE SLS on blue foam oval;  1 pole A for balance    Hip ADduction  Right;Left;10 reps;Strengthening    Hip ADduction Limitations  green TB at ankle + opp LE SLS on blue foam oval;  1 pole A for balance    Hip Abduction  Right;Left;10 reps;Knee straight;Stengthening    Abduction Limitations  green TB at ankle + opp LE SLS on blue foam oval;  2 pole A for balance    Hip Extension  Right;Left;10 reps;Knee straight;Stengthening    Extension Limitations  green TB at ankle + opp LE SLS on blue foam oval;  2 pole A for balance    Other Standing Knee Exercises  B Fitter hip abduction & extension (1 black/1 blue) x 15 each     Other Standing Knee Exercises  B side stepping & fwd/back monster walk with looed red TB at ankles 2 x 25 ft                  PT Long Term Goals - 06/22/19 1216      PT LONG TERM GOAL #1   Title   Independent with ongoing HEP and /or gym program    Status  Partially Met      PT LONG TERM GOAL #2   Title  L knee AROM >/= 0-120 dg to allow for normal gait and stair mechanics    Status  Partially Met      PT LONG TERM GOAL #3   Title  L LE strength >/= 4+/5 from improved stability    Status  Partially Met      PT LONG TERM GOAL #4   Title  Patient will ambulate with normal gait pattern and negotiate stairs reciprocally with good step    Status  Partially Met      PT LONG TERM GOAL #5   Title  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 Calf stretching revealing normal to excessive DF ROM without any obvious gastroc/soleus tightness. Continued progression of proximal LE strengthening with increased resistance and addition of dynamic/unstable surfaces to promote improved balance and proprioception with good tolerance reported - green TB provided for 4-way SLR at home. Anthony Kane continues to demonstrate good progress toward goals.    Personal Factors and Comorbidities  Comorbidity 3+    Comorbidities  R TKA; L TSA; R RTC repair    Rehab Potential  Excellent    PT Frequency  3x / week    PT Duration  4 weeks    PT Treatment/Interventions  ADLs/Self Care Home Management;Cryotherapy;Electrical Stimulation;Gait training;Stair training;Functional mobility training;Therapeutic activities;Therapeutic exercise;Balance training;Neuromuscular re-education;Patient/family education;Manual techniques;Scar  mobilization;Passive range of motion;Dry needling;Taping;Joint Manipulations    PT Next Visit Plan  L knee ROM focsusing on extension;  L LE strengthening; manual therapy and modalities PRN    PT Home Exercise Plan  06/10/19 - SLR, SAQ, extension stretches; 06/15/19 - heel raises, supported squats & TKE with black TB; 06/17/19 - standing 4-way SLR with red TB (progressed to green TB with addtional of Airex pad on 06/26/19), self-STM with rolling pin to quads/ITB;  06/22/19 - L SLS  "clocks"    Consulted and Agree with Plan of Care  Patient       Patient will benefit from skilled therapeutic intervention in order to improve the following deficits and impairments:  Abnormal gait, Decreased activity tolerance, Decreased balance, Decreased endurance, Decreased knowledge of precautions, Decreased mobility, Decreased range of motion, Decreased scar mobility, Decreased strength, Difficulty walking, Increased edema, Increased fascial restricitons, Increased muscle spasms, Impaired flexibility, Improper body mechanics, Postural dysfunction, Pain  Visit Diagnosis: Stiffness of left knee, not elsewhere classified  Acute pain of left knee  Muscle weakness (generalized)  Difficulty in walking, not elsewhere classified     Problem List Patient Active Problem List   Diagnosis Date Noted  . Primary osteoarthritis of left knee 05/26/2019  . 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 Complete tear of rotator cuff 05/08/2013    Percival Spanish, PT, MPT 06/26/2019, 10:50 AM  [facility] [address]  [place], [place], [address] Phone: [phone]   Fax:  [phone]  Name: Anthony Kane MRN: [id number] Date of Birth: 01/17/1951

## 2019-06-29 ENCOUNTER — Ambulatory Visit: Payer: BC Managed Care – PPO | Admitting: Physical Therapy

## 2019-06-29 ENCOUNTER — Other Ambulatory Visit: Payer: Self-pay

## 2019-06-29 ENCOUNTER — Encounter: Payer: Self-pay | Admitting: Physical Therapy

## 2019-06-29 DIAGNOSIS — R262 Difficulty in walking, not elsewhere classified: Secondary | ICD-10-CM

## 2019-06-29 DIAGNOSIS — M25662 Stiffness of left knee, not elsewhere classified: Secondary | ICD-10-CM | POA: Diagnosis not present

## 2019-06-29 DIAGNOSIS — M6281 Muscle weakness (generalized): Secondary | ICD-10-CM

## 2019-06-29 DIAGNOSIS — M25562 Pain in left knee: Secondary | ICD-10-CM

## 2019-06-29 NOTE — Therapy (Signed)
[facility] [address]  [place], [place], [address] Phone: [phone]   Fax:  [phone]  Physical Therapy Treatment  Patient Details  Name: Anthony Kane MRN: [id number] Date of Birth: 05/14/1951 Referring Provider (PT): Melrose Nakayama, MD   Encounter Date: 06/29/2019  PT End of Session - 06/29/19 0931    Visit Number  8    Number of Visits  12    Date for PT Re-Evaluation  07/08/19    Authorization Type  BCBS & Medicare    PT Start Time  [id number]    PT Stop Time  1018    PT Time Calculation (min)  47 min    Activity Tolerance  Patient tolerated treatment well    Behavior During Therapy  [facility] for tasks assessed/performed       Past Medical History:  Diagnosis Date  . Allergy   . Arthritis    Shoulder and thumb  . Cataract   . History of blood transfusion 1983  . Prediabetes    i was but i changed my diet and had a another check and they did say anything about it , denies     Past Surgical History:  Procedure Laterality Date  . 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 arthroscopy of knee Bilateral    2 x each knee  . bunion surgery Right   . COLONOSCOPY    . EYE SURGERY Bilateral    intraocular lens- cataract  . HAMMER TOE SURGERY Right   . HAND SURGERY Right    x 9  . JOINT REPLACEMENT Right 2011 or 2010   knee  . SHOULDER OPEN ROTATOR CUFF REPAIR Right 05/08/2013   Procedure: RIGHT SHOULDER OPEN ROTATOR CUFF REPAIR with ANCHORS;  Surgeon: Tobi Bastos, MD;  Location: [facility];  Service: Orthopedics;  Laterality: Right;  . TONSILLECTOMY  age 68  . TOTAL KNEE ARTHROPLASTY Left 05/26/2019   Procedure: LEFT TOTAL KNEE ARTHROPLASTY;  Surgeon: Melrose Nakayama, MD;  Location[facility];  Service: Orthopedics;  Laterality: Left;  . TOTAL SHOULDER ARTHROPLASTY Left 08/17/2016   Procedure: left reverse shoulder arthroplasty;  Surgeon: Netta Cedars, MD;  Location: [place];  Service: Orthopedics;   Laterality: Left;    There were no vitals filed for this visit.  Subjective Assessment - 06/29/19 0933    Subjective  Pt reporting knee pain woke him up at 5:00 this morning, but pain better now that he has been moving around. Still having some pain near ankle as well as feeling of bruise in thigh where tourniquet was during surgery.    Pertinent History  L TKA 05/26/19    Patient Stated Goals  "better balance and flexibility, and able to get the swelling to subside"    Currently in Pain?  No/denies    Pain Score  0-No pain    Pain Location  Knee    Pain Score  2    Pain Location  Ankle    Pain Orientation  Left    Pain Descriptors / Indicators  Aching    Pain Type  Acute pain    Pain Frequency  Intermittent                       [facility] Adult PT Treatment/Exercise - 06/29/19 0931      Exercises   Exercises  Knee/Hip      Knee/Hip Exercises: Aerobic   Recumbent Bike  L3 x 6 min      Knee/Hip Exercises: Standing   Forward Lunges  Left;10 reps;Right;3 seconds    Forward Lunges Limitations  cues for proper alignment; single UE support on counter with Airex pad for rear knee touch    Side Lunges  Right;Left;10 reps;3 seconds    Side Lunges Limitations  TRX - cues to knee fwd of toes and/or hip lateral to knee    Forward Step Up  Left;15 reps;Step Height: 8";Hand Hold: 1    Forward Step Up Limitations  + black TB TKE    Step Down  Left;15 reps;Step Height: 8";Hand Hold: 1;2 sets    Step Down Limitations  lateral & fwd eccentric lowering with light heel touch to floor    Functional Squat  15 reps;5 seconds    Functional Squat Limitations  TRX - triple extension      Knee/Hip Exercises: Supine   Bridges with Ball Squeeze  Both;15 reps;Strengthening   + alt knee extension   Other Supine Knee/Hip Exercises  HS curl + bridge with heels on peanut ball x10      Manual Therapy   Soft tissue mobilization  Roller stick to lateral quads & ITB.    Other Manual Therapy   Instructed pt in use of foam roller for lateral quads, HS & ITB.                  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 Patient noting some fatigue during exercises but denies increased pain. Introduced HS curl + bridge with weakness noted, therefore provided guidance on how to better recruit hamstrings. Patient expressing desire to return to balance and proprioceptive training next visit to assist in preparation for return to work. Patient progressing well with PT and appears to be on track to transition to HEP at end of current POC.    Personal Factors and Comorbidities  Comorbidity 3+    Comorbidities  R TKA; L TSA; R RTC repair    Rehab Potential  Excellent    PT Frequency  3x / week    PT Duration  4 weeks  PT Treatment/Interventions  ADLs/Self Care Home Management;Cryotherapy;Electrical Stimulation;Gait training;Stair training;Functional mobility training;Therapeutic activities;Therapeutic  exercise;Balance training;Neuromuscular re-education;Patient/family education;Manual techniques;Scar mobilization;Passive range of motion;Dry needling;Taping;Joint Manipulations    PT Next Visit Plan  balance & proprioception training; L knee ROM focsusing on extension; L LE strengthening; manual therapy and modalities PRN    PT Home Exercise Plan  06/10/19 - SLR, SAQ, extension stretches; 06/15/19 - heel raises, supported squats & TKE with black TB; 06/17/19 - standing 4-way SLR with red TB (progressed to green TB with addtional of Airex pad on 06/26/19), self-STM with rolling pin to quads/ITB;  06/22/19 - L SLS  "clocks"    Consulted and Agree with Plan of Care  Patient       Patient will benefit from skilled therapeutic intervention in order to improve the following deficits and impairments:  Abnormal gait, Decreased activity tolerance, Decreased balance, Decreased endurance, Decreased knowledge of precautions, Decreased mobility, Decreased range of motion, Decreased scar mobility, Decreased strength, Difficulty walking, Increased edema, Increased fascial restricitons, Increased muscle spasms, Impaired flexibility, Improper body mechanics, Postural dysfunction, Pain  Visit Diagnosis: Stiffness of left knee, not elsewhere classified  Acute pain of left knee  Muscle weakness (generalized)  Difficulty in walking, not elsewhere classified     Problem List Patient Active Problem List   Diagnosis Date Noted  . Primary osteoarthritis of left knee 05/26/2019  . 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 Complete tear of rotator cuff 05/08/2013    Percival Spanish, PT, MPT 06/29/2019, 10:28 AM  [facility] [address]  [address] [place], [place], [address] Phone: [phone]   Fax:  [phone]  Name: CELEDONIO SORTINO MRN: [id number] Date of Birth: [birth year]

## 2019-07-01 ENCOUNTER — Encounter: Payer: BC Managed Care – PPO | Admitting: Physical Therapy

## 2019-07-03 ENCOUNTER — Ambulatory Visit: Payer: BC Managed Care – PPO

## 2019-07-03 ENCOUNTER — Other Ambulatory Visit: Payer: Self-pay

## 2019-07-03 DIAGNOSIS — M25662 Stiffness of left knee, not elsewhere classified: Secondary | ICD-10-CM | POA: Diagnosis not present

## 2019-07-03 DIAGNOSIS — R262 Difficulty in walking, not elsewhere classified: Secondary | ICD-10-CM

## 2019-07-03 DIAGNOSIS — M6281 Muscle weakness (generalized): Secondary | ICD-10-CM

## 2019-07-03 DIAGNOSIS — M25562 Pain in left knee: Secondary | ICD-10-CM

## 2019-07-03 NOTE — Therapy (Signed)
[facility] [address]  [place], [place], [address] Phone: [phone]   Fax:  [phone]  Physical Therapy Treatment  Patient Details  Name: Anthony Kane MRN: [id number] Date of Birth: [birth year] Referring Provider (PT): Melrose Nakayama, MD   Encounter Date: 07/03/2019  PT End of Session - 07/03/19 0942    Visit Number  9    Number of Visits  12    Date for PT Re-Evaluation  07/08/19    Authorization Type  BCBS & Medicare    PT Start Time  0931    PT Stop Time  1015    PT Time Calculation (min)  44 min    Activity Tolerance  Patient tolerated treatment well    Behavior During Therapy  [facility] for tasks assessed/performed       Past Medical History:  Diagnosis Date  . Allergy   . Arthritis    Shoulder and thumb  . Cataract   . History of blood transfusion 1983  . Prediabetes    i was but i changed my diet and had a another check and they did say anything about it , denies     Past Surgical History:  Procedure Laterality Date  . 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 arthroscopy of knee Bilateral    2 x each knee  . bunion surgery Right   . COLONOSCOPY    . EYE SURGERY Bilateral    intraocular lens- cataract  . HAMMER TOE SURGERY Right   . HAND SURGERY Right    x 9  . JOINT REPLACEMENT Right 2011 or 2010   knee  . SHOULDER OPEN ROTATOR CUFF REPAIR Right 05/08/2013   Procedure: RIGHT SHOULDER OPEN ROTATOR CUFF REPAIR with ANCHORS;  Surgeon: Tobi Bastos, MD;  Location: [facility];  Service: Orthopedics;  Laterality: Right;  . TONSILLECTOMY  age 79  . TOTAL KNEE ARTHROPLASTY Left 05/26/2019   Procedure: LEFT TOTAL KNEE ARTHROPLASTY;  Surgeon: Melrose Nakayama, MD;  [facility];  Service: Orthopedics;  Laterality: Left;  . TOTAL SHOULDER ARTHROPLASTY Left 08/17/2016   Procedure: left reverse shoulder arthroplasty;  Surgeon: Netta Cedars, MD;  Location: [place];  Service: Orthopedics;   Laterality: Left;    There were no vitals filed for this visit.  Subjective Assessment - 07/03/19 0939    Subjective  Pt. noting thigh and ankle is not bothering him now.  Notes, "ive been nearly pain free for three days".    Pertinent History  L TKA 05/26/19    How long can you stand comfortably?  8-10 min    How long can you walk comfortably?  15 min    Patient Stated Goals  "better balance and flexibility, and able to get the swelling to subside"    Currently in Pain?  No/denies    Pain Score  0-No pain    Multiple Pain Sites  No         [facility] PT Assessment - 07/03/19 0001      Assessment   Medical Diagnosis  L TKA    Referring Provider (PT)  Melrose Nakayama, MD    Onset Date/Surgical Date  05/26/19    Hand Dominance  Right;Left    Next MD Visit  07/24/19    Prior Therapy  [facility] PT x 4 visits                   [facility] Adult PT  Treatment/Exercise - 07/03/19 0001      Neuro Re-ed    Neuro Re-ed Details   Alternating cone nock over/righting on foam balance beam x 5 cones - terminted due to excessive ankle instability       Knee/Hip Exercises: Stretches   Piriformis Stretch  Left;1 rep;30 seconds    Piriformis Stretch Limitations  Figure-4 seated; hand support at knee to reduce knee discomfort     Gastroc Stretch  Left;30 seconds;1 rep    Gastroc Stretch Limitations  runner's stretch at wall      Knee/Hip Exercises: Aerobic   Recumbent Bike  L3 x 6 min      Knee/Hip Exercises: Machines for Strengthening   Cybex Leg Press  L only: 35# x 15 reps       Knee/Hip Exercises: Standing   Side Lunges  Right;Left;10 reps;3 seconds    Side Lunges Limitations  TRX - cues to knee fwd of toes and/or hip lateral to knee    SLS with Vectors  L SLS on blue foam oval with opposite LE cone toe touch 5 x 4 cones; ski pole    Other Standing Knee Exercises  Alternating cone nock overs/righting standing on airex pad x 10 reps each LE                  PT Long Term Goals -  06/22/19 1216      PT LONG TERM GOAL #1   Title  Independent with ongoing HEP and /or gym program    Status  Partially Met      PT LONG TERM GOAL #2   Title  L knee AROM >/= 0-120 dg to allow for normal gait and stair mechanics    Status  Partially Met      PT LONG TERM GOAL #3   Title  L LE strength >/= 4+/5 from improved stability    Status  Partially Met      PT LONG TERM GOAL #4   Title  Patient will ambulate with normal gait pattern and negotiate stairs reciprocally with good step    Status  Partially Met      PT LONG TERM GOAL #5   Title  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  Progressed dynamic L SLS activities on foam today for balance and LE stability - most difficulty with L SLS "clocks" to cones standing on foam.  Progressed to 35# L single leg leg-press and continued lateral lunge on TRX without increased pain. Pt. did complaint of L glute soreness after last therex activities which was relieved with seated figure-4 stretch.  Will continue to progress balance/stability activities.    Personal Factors and Comorbidities  Comorbidity 3+    Comorbidities  R TKA; L TSA; R RTC repair    PT Treatment/Interventions  ADLs/Self Care Home Management;Cryotherapy;Electrical Stimulation;Gait training;Stair training;Functional mobility training;Therapeutic activities;Therapeutic exercise;Balance training;Neuromuscular re-education;Patient/family education;Manual techniques;Scar mobilization;Passive range of motion;Dry needling;Taping;Joint Manipulations    PT Next Visit Plan  balance & proprioception training; L knee ROM focsusing on extension; L LE  strengthening; manual therapy and  modalities PRN    PT Home Exercise Plan  06/10/19 - SLR, SAQ, extension stretches; 06/15/19 - heel raises, supported squats & TKE with black TB; 06/17/19 - standing 4-way SLR with red TB (progressed to green TB with addtional of Airex pad on 06/26/19), self-STM with rolling pin to quads/ITB;  06/22/19 - L SLS  "clocks";  07/03/19 - updated "clocks" with pillow underneath foot and pt. planning to put "grip pad" down for safety    Consulted and Agree with Plan of Care  Patient       Patient will benefit from skilled therapeutic intervention in order to improve the following deficits and impairments:  Abnormal gait, Decreased activity tolerance, Decreased balance, Decreased endurance, Decreased knowledge of precautions, Decreased mobility, Decreased range of motion, Decreased scar mobility, Decreased strength, Difficulty walking, Increased edema, Increased fascial restricitons, Increased muscle spasms, Impaired flexibility, Improper body mechanics, Postural dysfunction, Pain  Visit Diagnosis: Stiffness of left knee, not elsewhere classified  Acute pain of left knee  Muscle weakness (generalized)  Difficulty in walking, not elsewhere classified     Problem List Patient Active Problem List   Diagnosis Date Noted  . Primary osteoarthritis of left knee 05/26/2019  . 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 Complete tear of rotator cuff 05/08/2013    Bess Harvest, PTA 07/03/19 10:56 AM   [facility] [address]  [place], [place], [address] Phone: [phone]   Fax:  [phone]  Name: OBRYAN RADU MRN: [id number] Date of Birth: 05/12/1951

## 2019-07-06 ENCOUNTER — Encounter: Payer: Self-pay | Admitting: Physical Therapy

## 2019-07-06 ENCOUNTER — Other Ambulatory Visit: Payer: Self-pay

## 2019-07-06 ENCOUNTER — Ambulatory Visit: Payer: BC Managed Care – PPO | Attending: Orthopaedic Surgery | Admitting: Physical Therapy

## 2019-07-06 DIAGNOSIS — M6281 Muscle weakness (generalized): Secondary | ICD-10-CM | POA: Diagnosis present

## 2019-07-06 DIAGNOSIS — M25562 Pain in left knee: Secondary | ICD-10-CM | POA: Diagnosis present

## 2019-07-06 DIAGNOSIS — R262 Difficulty in walking, not elsewhere classified: Secondary | ICD-10-CM | POA: Diagnosis present

## 2019-07-06 DIAGNOSIS — M25662 Stiffness of left knee, not elsewhere classified: Secondary | ICD-10-CM | POA: Diagnosis not present

## 2019-07-06 NOTE — Therapy (Signed)
[facility] [address]  [place], [place], [address] Phone: [phone]   Fax:  [phone]  Physical Therapy Treatment / Discharge Summary  Patient Details  Name: Anthony Kane MRN: [id number] Date of Birth: 07/28/1951 Referring Provider (PT): Melrose Nakayama, MD  Progress Note  Reporting Period 06/10/2019 to 07/06/2019  See note below for Objective Data and Assessment of Progress/Goals.     Encounter Date: 07/06/2019  PT End of Session - 07/06/19 0848    Visit Number  10    Number of Visits  12    Date for PT Re-Evaluation  07/08/19    Authorization Type  BCBS & Medicare    PT Start Time  0848    PT Stop Time  0941    PT Time Calculation (min)  53 min    Activity Tolerance  Patient tolerated treatment well    Behavior During Therapy  [facility] for tasks assessed/performed       Past Medical History:  Diagnosis Date  . Allergy   . Arthritis    Shoulder and thumb  . Cataract   . History of blood transfusion 1983  . Prediabetes    i was but i changed my diet and had a another check and they did say anything about it , denies     Past Surgical History:  Procedure Laterality Date  . 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 arthroscopy of knee Bilateral    2 x each knee  . bunion surgery Right   . COLONOSCOPY    . EYE SURGERY Bilateral    intraocular lens- cataract  . HAMMER TOE SURGERY Right   . HAND SURGERY Right    x 9  . JOINT REPLACEMENT Right 2011 or 2010   knee  . SHOULDER OPEN ROTATOR CUFF REPAIR Right 05/08/2013   Procedure: RIGHT SHOULDER OPEN ROTATOR CUFF REPAIR with ANCHORS;  Surgeon: Tobi Bastos, MD;  Location: [facility];  Service: Orthopedics;  Laterality: Right;  . TONSILLECTOMY  age 68  . TOTAL KNEE ARTHROPLASTY Left 05/26/2019   Procedure: LEFT TOTAL KNEE ARTHROPLASTY;  Surgeon: Melrose Nakayama, MD;  Location: [facility];  Service: Orthopedics;  Laterality: Left;  . TOTAL  SHOULDER ARTHROPLASTY Left 08/17/2016   Procedure: left reverse shoulder arthroplasty;  Surgeon: Netta Cedars, MD;  Location: [facility];  Service: Orthopedics;  Laterality: Left;    There were no vitals filed for this visit.  Subjective Assessment - 07/06/19 0852    Subjective  Pt reporting sleep disturbance due to L LE discomfort ("not really pain") and stiffness. Still having some discomfort/unusual sensation in distal medial shin just proximal to ankle.    Pertinent History  L TKA 05/26/19    Patient Stated Goals  "better balance and flexibility, and able to get the swelling to subside"    Currently in Pain?  Yes    Pain Score  1    1.5/10; 2-2.5/10 at night   Pain Location  Knee    Pain Orientation  Left    Pain Descriptors / Indicators  Discomfort    Pain Type  Surgical pain;Acute pain    Pain Frequency  Intermittent         [facility] PT Assessment - 07/06/19 0848      Assessment   Medical Diagnosis  L TKA    Referring Provider (PT)  Melrose Nakayama, MD    Onset Date/Surgical Date  05/26/19  Next MD Visit  07/24/19      Observation/Other Assessments   Focus on Therapeutic Outcomes (FOTO)   Knee - 79% (21% limitation)      AROM   Right Knee Extension  0    Right Knee Flexion  130    Left Knee Extension  0    Left Knee Flexion  131      PROM   Left Knee Extension  0    Left Knee Flexion  135      Strength   Right Hip Flexion  5/5    Right Hip Extension  5/5    Right Hip ABduction  4+/5    Right Hip ADduction  5/5    Left Hip Flexion  4+/5    Left Hip Extension  5/5    Left Hip ABduction  4+/5    Left Hip ADduction  4+/5    Right Knee Flexion  5/5    Right Knee Extension  5/5    Left Knee Flexion  5/5    Left Knee Extension  5/5                   [facility] Adult PT Treatment/Exercise - 07/06/19 0848      Ambulation/Gait   Ambulation/Gait Assistance  7: Independent    Assistive device  None    Gait Pattern  Within Functional Limits    Stairs Assistance   7: Independent    Stair Management Technique  No rails;Alternating pattern    Number of Stairs  14    Height of Stairs  7      Knee/Hip Exercises: Aerobic   Elliptical  L2.0 x 6 min      Knee/Hip Exercises: Standing   Terminal Knee Extension  Left;15 reps;Strengthening;Theraband    Theraband Level (Terminal Knee Extension)  --   black   Step Down  Left;15 reps;Step Height: 8";Hand Hold: 1;2 sets    Step Down Limitations  lateral & fwd eccentric lowering with light heel touch to floor    Other Standing Knee Exercises  B tandem & L SLS pallof presses with red TB x15 each             PT Education - 07/06/19 0940    Education Details  HEP review & update - pallof press, step-downs    Person(s) Educated  Patient    Methods  Explanation;Demonstration;Handout    Comprehension  Verbalized understanding;Returned demonstration          PT Long Term Goals - 07/06/19 0858      PT LONG TERM GOAL #1   Title  Independent with ongoing HEP and /or gym program    Status  Achieved      PT LONG TERM GOAL #2   Title  L knee AROM >/= 0-120 dg to allow for normal gait and stair mechanics    Status  Achieved      PT LONG TERM GOAL #3   Title  L LE strength >/= 4+/5 from improved stability    Status  Achieved      PT LONG TERM GOAL #4   Title  Patient will ambulate with normal gait pattern and negotiate stairs reciprocally with good step pattern    Status  Achieved      PT LONG TERM GOAL #5   Title  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 L knee AROM now 0-131 dg and PROM 0-135 dg with overall LE strength 4+/5 to 5/5. He is ambulating with normal gait and able to navigate stairs  reciprocally with good step pattern. 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 All goals met and Anthony Kane feels ready to transition to HEP at this time, therefore will proceed with discharge from PT for this episode.    Personal Factors and Comorbidities  Comorbidity 3+    Comorbidities  R TKA; L TSA; R RTC repair    Rehab Potential  Excellent    PT Treatment/Interventions  ADLs/Self Care Home Management;Cryotherapy;Electrical Stimulation;Gait training;Stair training;Functional mobility training;Therapeutic activities;Therapeutic exercise;Balance training;Neuromuscular re-education;Patient/family education;Manual techniques;Scar mobilization;Passive range of motion;Dry needling;Taping;Joint Manipulations    PT Next Visit Plan  Discharge    PT Home Exercise Plan  06/10/19 - SLR, SAQ, extension stretches; 06/15/19 - heel raises, supported squats & TKE with black TB; 06/17/19 - standing 4-way SLR with red TB (progressed to green TB with addtional of Airex pad on 06/26/19), self-STM with rolling pin to quads/ITB;  06/22/19 - L SLS  "clocks";  07/03/19 - updated "clocks" with pillow underneath foot and pt. planning to put "grip pad" down for safety    Consulted and Agree with Plan of Care  Patient       Patient will benefit from skilled therapeutic intervention in order to improve the following deficits and impairments:  Abnormal gait, Decreased activity tolerance, Decreased balance, Decreased endurance, Decreased knowledge of precautions, Decreased mobility, Decreased range of motion, Decreased scar mobility, Decreased strength, Difficulty walking, Increased edema, Increased fascial restricitons, Increased muscle spasms, Impaired flexibility, Improper body mechanics, Postural dysfunction, Pain  Visit Diagnosis: Stiffness of left knee, not elsewhere classified  Acute pain of left knee  Muscle weakness  (generalized)  Difficulty in walking, not elsewhere classified     Problem List Patient Active Problem List   Diagnosis Date Noted  . Primary osteoarthritis of left knee 05/26/2019  . 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 Complete tear of rotator cuff 05/08/2013   PHYSICAL THERAPY DISCHARGE SUMMARY  Visits from Start of Care: 10  Current functional level related to goals / functional outcomes:   Refer to above clinical impression.   Remaining deficits:   None.   Education / Equipment:   HEP & gym program  Plan: Patient agrees to discharge.  Patient goals were met. Patient is being discharged due to meeting the stated rehab goals.  ?????      Percival Spanish, PT, MPT 07/06/2019, 9:56 AM  [facility] [address]  [place], [place], [address] Phone: [phone]   Fax:  [phone]  Name: Anthony Kane MRN: [id number] Date of Birth: 1950-12-13

## 2019-07-08 ENCOUNTER — Encounter: Payer: BC Managed Care – PPO | Admitting: Physical Therapy

## 2019-07-10 ENCOUNTER — Encounter: Payer: BC Managed Care – PPO | Admitting: Physical Therapy

## 2021-08-11 ENCOUNTER — Other Ambulatory Visit: Payer: Self-pay | Admitting: Orthopedic Surgery

## 2021-08-11 DIAGNOSIS — M79641 Pain in right hand: Secondary | ICD-10-CM

## 2021-08-19 ENCOUNTER — Other Ambulatory Visit: Payer: Self-pay

## 2021-08-19 ENCOUNTER — Ambulatory Visit
Admission: RE | Admit: 2021-08-19 | Discharge: 2021-08-19 | Disposition: A | Discharge status: Home / Self Care | Payer: BC Managed Care – PPO | Source: Ambulatory Visit | Attending: Orthopedic Surgery | Admitting: Orthopedic Surgery

## 2021-08-19 DIAGNOSIS — M79641 Pain in right hand: Secondary | ICD-10-CM

## 2021-08-19 MED ORDER — GADOBENATE DIMEGLUMINE 529 MG/ML IV SOLN
20.0000 mL | Freq: Once | INTRAVENOUS | Status: AC | PRN
  Administered 2021-08-19: 20 mL via INTRAVENOUS

## 2022-09-17 IMAGING — MR MR WRIST*R* WO/W CM
8 series · 40 of 40 positions shown · IV contrast (Multihance 20cc)
Comparison: None.

CLINICAL DATA: Right distal forearm mass and hand swelling for 39
years

EXAM:
MRI OF THE RIGHT HAND WITHOUT AND WITH CONTRAST; MRI OF THE RIGHT
WRIST WITHOUT AND WITH CONTRAST
TECHNIQUE: Multiplanar, multisequence MR imaging of the right wrist and hand
was performed before and after the administration of intravenous
contrast.
CONTRAST:  20mL MULTIHANCE GADOBENATE DIMEGLUMINE 529 MG/ML IV SOLN

[Series 9: T2 fat-sat · axial · right · 3.0mm · 0.38mm/px · z∈[-50,+84]mm · 7 of 40 slices shown]
[im 1/40]
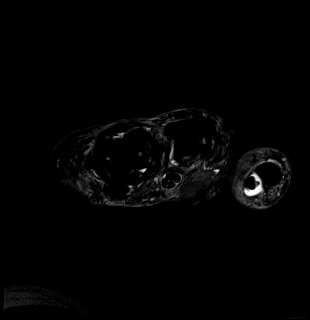
[im 7/40]
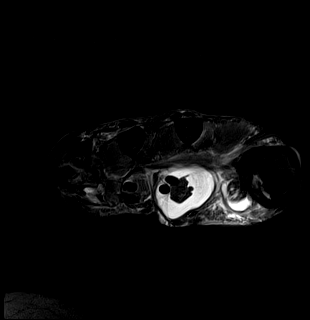
[im 14/40]
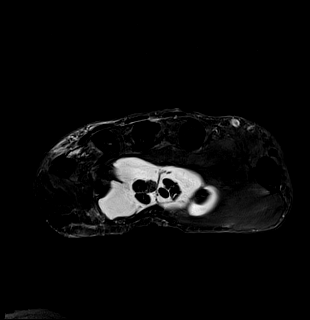
[im 20/40]
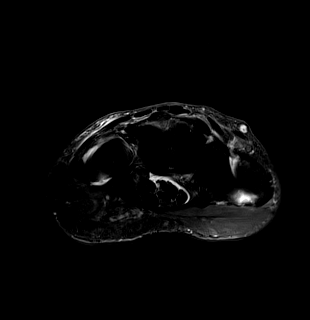
[im 27/40]
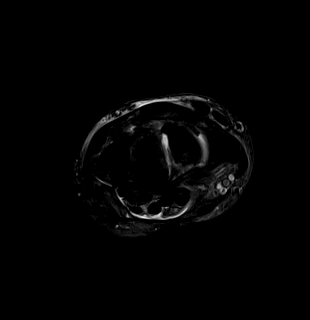
[im 33/40]
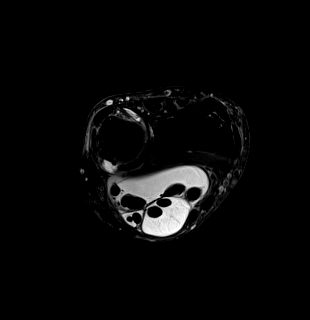
[im 40/40]
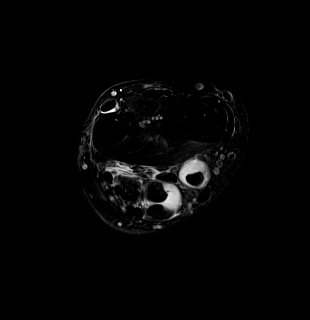

[Series 10: T1 · axial · right · 3.0mm · 0.38mm/px · z∈[-50,+84]mm · 7 of 40 slices shown (1 of 2)]
[im 1/40]
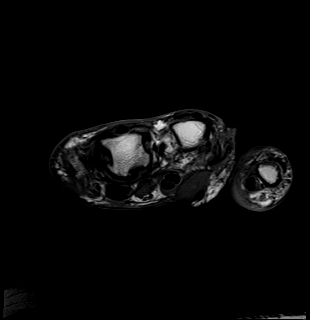
[im 7/40]
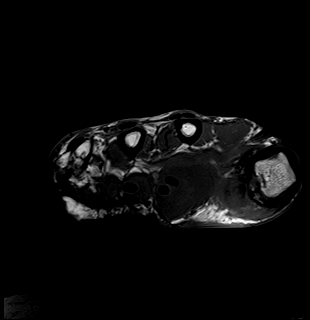
[im 14/40]
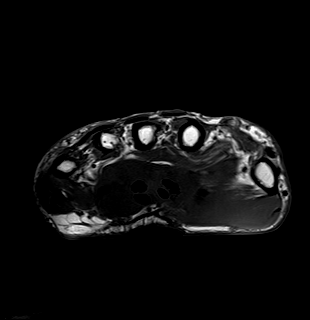
[im 20/40]
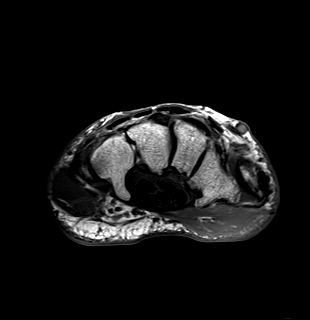
[im 27/40]
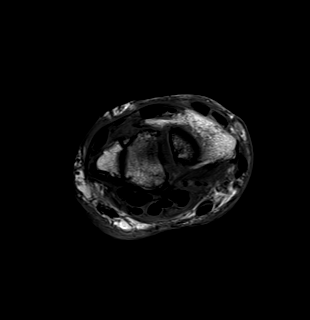
[im 33/40]
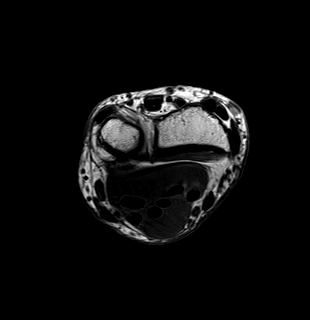
[im 40/40]
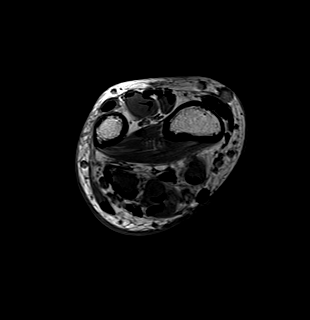

[Series 11: T1 · coronal · right · 3.0mm · 0.39mm/px · 3 of 20 slices shown (2 of 2)]
[im 1/20]
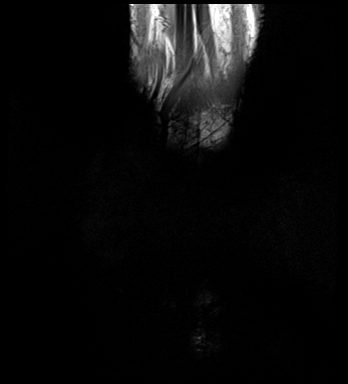
[im 10/20]
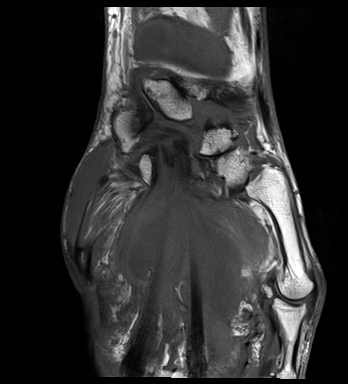
[im 20/20]
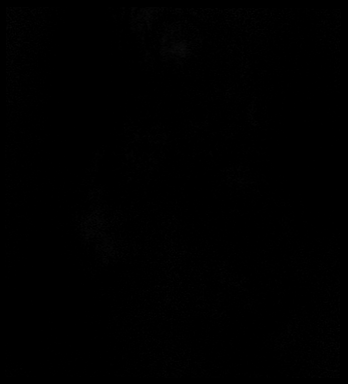

[Series 12: STIR · coronal · right · 3.0mm · 0.47mm/px · 3 of 20 slices shown]
[im 1/20]
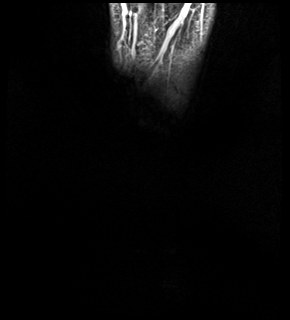
[im 10/20]
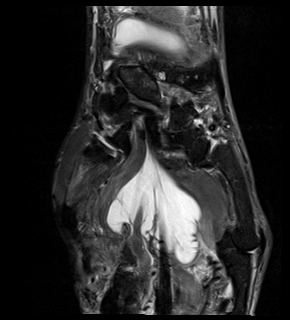
[im 20/20]
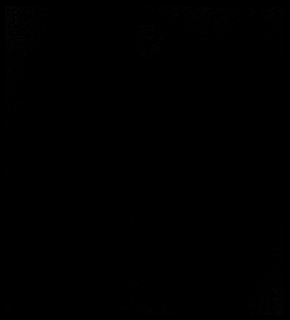

[Series 13: PD fat-sat · sagittal · right · 3.0mm · 0.47mm/px · 5 of 31 slices shown]
[im 1/31]
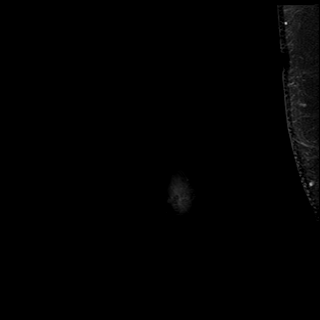
[im 8/31]
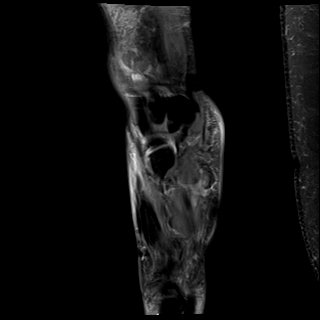
[im 16/31]
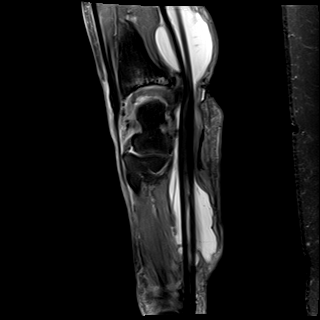
[im 23/31]
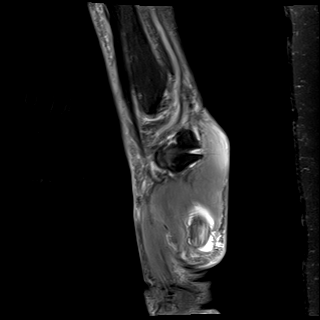
[im 31/31]
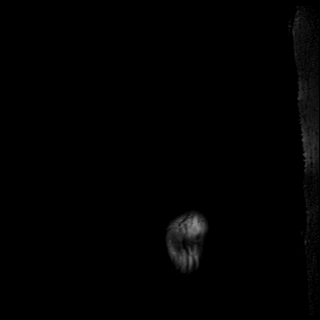

[Series 14: T1 fat-sat · axial · right · 3.0mm · 0.38mm/px · z∈[-47,+81]mm · 6 of 40 slices shown]
[im 1/40]
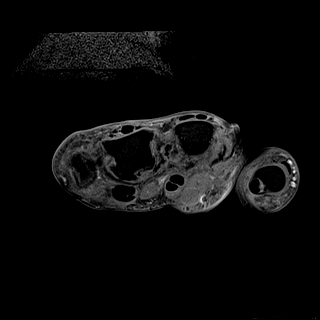
[im 8/40]
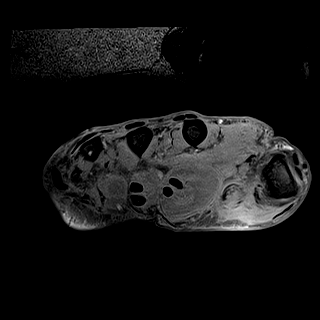
[im 16/40]
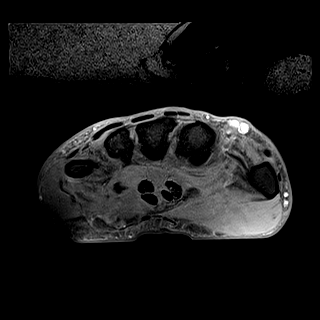
[im 24/40]
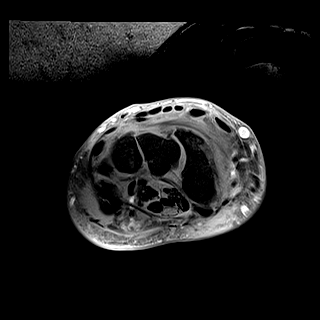
[im 32/40]
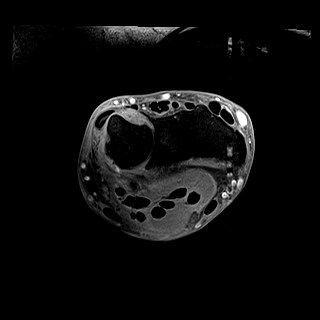
[im 40/40]
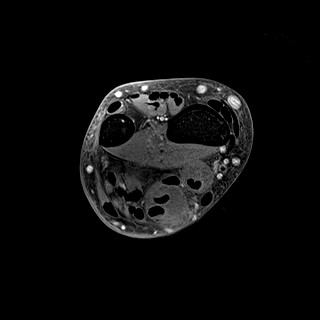

[Series 15: T1 fat-sat post-contrast · axial · right · 3.0mm · 0.38mm/px · z∈[-47,+81]mm · 6 of 40 slices shown (1 of 2)]
[im 1/40]
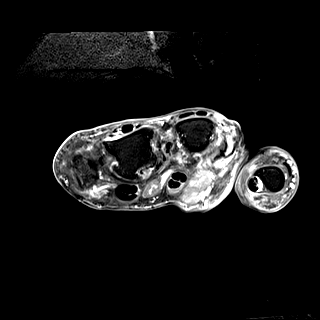
[im 8/40]
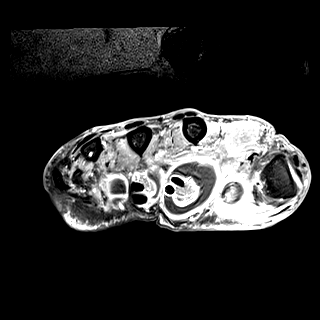
[im 16/40]
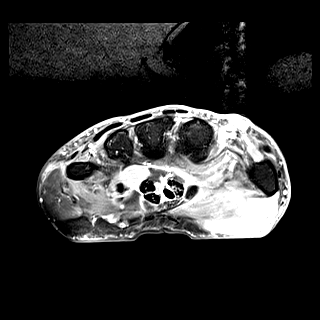
[im 24/40]
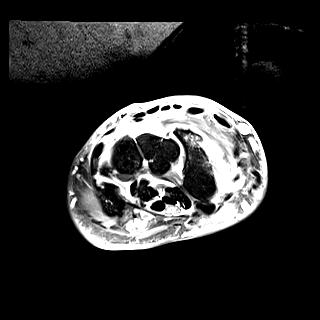
[im 32/40]
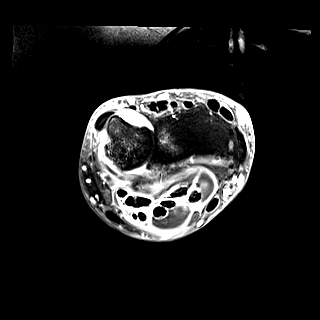
[im 40/40]
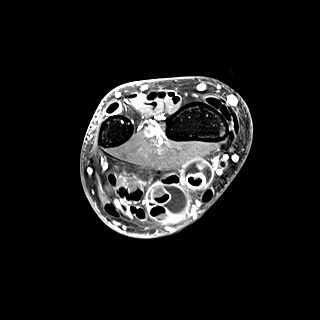

[Series 16: T1 fat-sat post-contrast · coronal · right · 3.0mm · 0.47mm/px · 3 of 20 slices shown (2 of 2)]
[im 1/20]
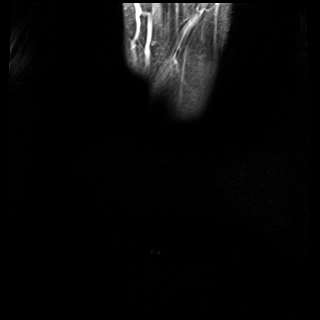
[im 10/20]
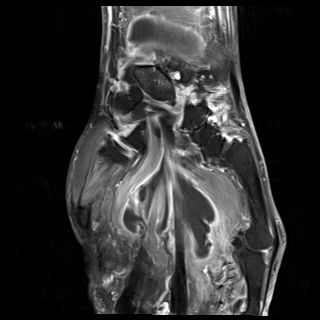
[im 20/20]
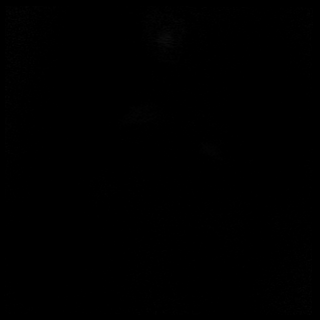

[40 of 40 positions shown; findings below may reference images not displayed]

FINDINGS: Bones/Joint/Cartilage

Prior fourth and fifth digit amputations with residual fourth and
fifth metacarpals.

Scapholunate insufficiency with proximal migration of the capitate
and the lunate, with severe radiocarpal joint space narrowing and
near bone-on-bone articulation of the lunate with the radius and
distal ulna. There is associated periarticular bony edema and cystic
change consistent with osteoarthritis. Dorsal tilting of the lunate.
There is no evidence of acute fracture.

There is moderate osteoarthritis of the triscaphe joint, first
carpometacarpal joint, and second and third metacarpophalangeal
joints.

There is reactive radiocarpal, intercarpal, and DRUJ effusion.

Ligaments

Scapholunate ligament insufficiency, with indistinct appearance of
the volar and dorsal components, periligamentous scarring, and
widening of the scapholunate interval. The lunotriquetral ligament
is likely intact. The second and third MCP collateral ligaments
appear thickened.

Muscles and Tendons

There is extensive tenosynovial fluid along the flexor tendons along
the distal forearm, wrist and palm of the hand which demonstrates
rim enhancement. This is likely palpable. At the level of the distal
radius and ulna, this fluid collection surrounding the tendons
measures 4.0 x 2.7 cm and at the level of the palm of the hand
measures up to 5.0 x 2.1 cm short axis (axial T2 images 6 and 29
respectively. There is minimal distension within the carpal tunnel.
Overall length measures at least 12.1 cm in proximal-distal extent,
extending proximally outside of the field of view (coronal stir
image 10).

There is severe tendinosis and interstitial tearing of the distal
flexor pollicis longus tendon.

There is subluxation of the extensor carpi ulnaris tendon, perched
on the outer aspect of the ulnar styloid, consistent with a complete
ECU subsheath tear. Mild ECU tendinosis and tenosynovitis. The other
extensor tendons are unremarkable with exception of the fourth and
fifth digit extensors due to prior amputation. The ulna appears
rotated within the sigmoid notch.

Soft tissues

There is and mild superficial soft tissue swelling throughout the
hand and wrist.
IMPRESSION: Extensive tenosynovial fluid along the flexor tendons along the
distal forearm, wrist, and palm of the hand which likely corresponds
to the palpable distal forearm mass. Given the history provided,
this is favored to be a chronic tenosynovial fluid collection. This
measures approximately 4.0 x 2.7 cm short axis at the level of the
distal radius and ulna and 5.0 x 2.1 cm short axis at the palm of
the hand. Length measures at least 12.1 cm in proximal-distal
extent.

Severe tendinosis and interstitial tearing of the distal flexor
pollicis longus tendon.

Findings of scapholunate advanced collapse, with chronic
scapholunate ligament tear/insufficiency, migration of the capitate,
severe radiocarpal arthritis, ulnar abutment, and dorsal tilt of the
lunate. Moderate osteoarthritis of the triscaphe joint, first CMC,
and second and third MCP joints.

Complete ECU subsheath tear with subluxation of the ECU tendon
perched on the outer aspect of the ulnar styloid. Mild ECU
tendinosis and tenosynovitis.
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
## 2023-01-18 ENCOUNTER — Encounter: Payer: Self-pay | Admitting: Physical Therapy

## 2023-01-18 ENCOUNTER — Ambulatory Visit: Payer: Worker's Compensation | Attending: Acute Care | Admitting: Physical Therapy

## 2023-01-18 DIAGNOSIS — R252 Cramp and spasm: Secondary | ICD-10-CM | POA: Diagnosis present

## 2023-01-18 DIAGNOSIS — R293 Abnormal posture: Secondary | ICD-10-CM | POA: Insufficient documentation

## 2023-01-18 DIAGNOSIS — M542 Cervicalgia: Secondary | ICD-10-CM | POA: Insufficient documentation

## 2023-01-18 NOTE — Patient Instructions (Signed)

## 2023-01-21 NOTE — Addendum Note (Signed)
Addended by: Jena Gauss on: 01/21/2023 06:36 PM   Modules accepted: Orders

## 2023-01-22 ENCOUNTER — Ambulatory Visit: Payer: Worker's Compensation | Admitting: Physical Therapy

## 2023-01-22 ENCOUNTER — Encounter: Payer: Self-pay | Admitting: Physical Therapy

## 2023-01-22 DIAGNOSIS — R252 Cramp and spasm: Secondary | ICD-10-CM

## 2023-01-22 DIAGNOSIS — M542 Cervicalgia: Secondary | ICD-10-CM | POA: Diagnosis not present

## 2023-01-22 DIAGNOSIS — R293 Abnormal posture: Secondary | ICD-10-CM
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
## 2023-01-25 ENCOUNTER — Encounter: Payer: Self-pay | Admitting: Physical Therapy

## 2023-01-25 ENCOUNTER — Ambulatory Visit: Payer: Worker's Compensation | Admitting: Physical Therapy

## 2023-01-25 DIAGNOSIS — R252 Cramp and spasm: Secondary | ICD-10-CM

## 2023-01-25 DIAGNOSIS — M542 Cervicalgia: Secondary | ICD-10-CM | POA: Diagnosis not present

## 2023-01-25 DIAGNOSIS — R293 Abnormal posture: Secondary | ICD-10-CM
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
## 2023-01-29 ENCOUNTER — Ambulatory Visit: Payer: Worker's Compensation

## 2023-01-29 ENCOUNTER — Other Ambulatory Visit: Payer: Self-pay

## 2023-01-29 DIAGNOSIS — M542 Cervicalgia: Secondary | ICD-10-CM | POA: Diagnosis not present

## 2023-01-29 DIAGNOSIS — R252 Cramp and spasm: Secondary | ICD-10-CM

## 2023-01-29 DIAGNOSIS — R293 Abnormal posture: Secondary | ICD-10-CM
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
## 2023-02-06 ENCOUNTER — Ambulatory Visit: Payer: Worker's Compensation | Attending: Acute Care

## 2023-02-06 DIAGNOSIS — R252 Cramp and spasm: Secondary | ICD-10-CM | POA: Insufficient documentation

## 2023-02-06 DIAGNOSIS — M542 Cervicalgia: Secondary | ICD-10-CM | POA: Insufficient documentation

## 2023-02-06 DIAGNOSIS — R293 Abnormal posture: Secondary | ICD-10-CM | POA: Insufficient documentation
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
## 2023-02-11 ENCOUNTER — Ambulatory Visit: Payer: Worker's Compensation

## 2023-02-13 ENCOUNTER — Encounter: Payer: Self-pay | Admitting: Physical Therapy

## 2023-02-13 ENCOUNTER — Ambulatory Visit: Payer: Worker's Compensation | Admitting: Physical Therapy

## 2023-02-13 DIAGNOSIS — M542 Cervicalgia: Secondary | ICD-10-CM | POA: Diagnosis not present

## 2023-02-13 DIAGNOSIS — R252 Cramp and spasm: Secondary | ICD-10-CM

## 2023-02-13 DIAGNOSIS — R293 Abnormal posture: Secondary | ICD-10-CM
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
## 2023-02-14 NOTE — Addendum Note (Signed)
Addended by: Jena Gauss on: 02/14/2023 11:30 AM   Modules accepted: Orders

## 2023-02-19 ENCOUNTER — Other Ambulatory Visit: Payer: Self-pay

## 2023-02-19 ENCOUNTER — Ambulatory Visit: Payer: Worker's Compensation

## 2023-02-19 DIAGNOSIS — R293 Abnormal posture: Secondary | ICD-10-CM

## 2023-02-19 DIAGNOSIS — M542 Cervicalgia: Secondary | ICD-10-CM

## 2023-02-19 DIAGNOSIS — R252 Cramp and spasm: Secondary | ICD-10-CM
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
## 2023-02-22 ENCOUNTER — Ambulatory Visit: Payer: Worker's Compensation | Admitting: Physical Therapy

## 2023-02-22 ENCOUNTER — Encounter: Payer: Self-pay | Admitting: Physical Therapy

## 2023-02-22 DIAGNOSIS — R252 Cramp and spasm: Secondary | ICD-10-CM

## 2023-02-22 DIAGNOSIS — M542 Cervicalgia: Secondary | ICD-10-CM

## 2023-02-22 DIAGNOSIS — R293 Abnormal posture: Secondary | ICD-10-CM
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
## 2023-02-26 ENCOUNTER — Other Ambulatory Visit: Payer: Self-pay

## 2023-02-26 ENCOUNTER — Ambulatory Visit: Payer: Worker's Compensation

## 2023-02-26 DIAGNOSIS — R252 Cramp and spasm: Secondary | ICD-10-CM

## 2023-02-26 DIAGNOSIS — R293 Abnormal posture: Secondary | ICD-10-CM

## 2023-02-26 DIAGNOSIS — M542 Cervicalgia: Secondary | ICD-10-CM
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
## 2023-02-28 ENCOUNTER — Encounter: Payer: Self-pay | Admitting: Internal Medicine

## 2023-03-01 ENCOUNTER — Encounter: Payer: Self-pay | Admitting: Physical Therapy

## 2023-03-01 ENCOUNTER — Ambulatory Visit: Payer: Worker's Compensation | Admitting: Physical Therapy

## 2023-03-01 DIAGNOSIS — R252 Cramp and spasm: Secondary | ICD-10-CM

## 2023-03-01 DIAGNOSIS — M542 Cervicalgia: Secondary | ICD-10-CM

## 2023-03-01 DIAGNOSIS — R293 Abnormal posture: Secondary | ICD-10-CM
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
## 2023-03-04 NOTE — Addendum Note (Signed)
Addended by: Jena Gauss on: 03/04/2023 12:14 PM   Modules accepted: Orders

## 2023-03-04 NOTE — Addendum Note (Signed)
Addended by: Jena Gauss on: 03/04/2023 12:12 PM   Modules accepted: Orders

## 2023-03-12 ENCOUNTER — Ambulatory Visit: Payer: Worker's Compensation | Attending: Acute Care

## 2023-03-12 ENCOUNTER — Other Ambulatory Visit: Payer: Self-pay

## 2023-03-12 DIAGNOSIS — R252 Cramp and spasm: Secondary | ICD-10-CM | POA: Diagnosis present

## 2023-03-12 DIAGNOSIS — M542 Cervicalgia: Secondary | ICD-10-CM | POA: Diagnosis present

## 2023-03-12 DIAGNOSIS — R293 Abnormal posture: Secondary | ICD-10-CM | POA: Diagnosis present
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
## 2023-05-30 ENCOUNTER — Other Ambulatory Visit: Payer: Self-pay | Admitting: Orthopedic Surgery

## 2023-05-30 DIAGNOSIS — M542 Cervicalgia: Secondary | ICD-10-CM

## 2023-06-01 ENCOUNTER — Inpatient Hospital Stay
Admission: RE | Admit: 2023-06-01 | Discharge: 2023-06-01 | Discharge status: Home / Self Care | Payer: Medicare Other | Source: Ambulatory Visit | Attending: Orthopedic Surgery | Admitting: Orthopedic Surgery

## 2023-06-01 DIAGNOSIS — M542 Cervicalgia: Secondary | ICD-10-CM

## 2023-07-16 ENCOUNTER — Other Ambulatory Visit: Payer: Self-pay | Admitting: Orthopedic Surgery

## 2023-07-30 NOTE — Pre-Procedure Instructions (Signed)
Surgical Instructions   Your procedure is scheduled on August 07, 2023. Report to [facility] Main Entrance "A" at 10:00 A.M., then check in with the Admitting office. Any questions or running late day of surgery: call [phone]  Questions prior to your surgery date: call [phone], Monday-Friday, 8am-4pm. If you experience any cold or flu symptoms such as cough, fever, chills, shortness of breath, etc. between now and your scheduled surgery, please notify us at the above number.     Remember:  Do not eat after midnight the night before your surgery  You may drink clear liquids until 10:00 AM the morning of your surgery.   Clear liquids allowed are: Water, Non-Citrus Juices (without pulp), Carbonated Beverages, Clear Tea (no milk, honey, etc.), Black Coffee Only (NO MILK, CREAM OR POWDERED CREAMER of any kind), and Gatorade.  Patient Instructions  The night before surgery:  No food after midnight. ONLY clear liquids after midnight  The day of surgery (if you do NOT have diabetes):  Drink ONE (1) Pre-Surgery Clear Ensure by 10:00 AM the morning of surgery. Drink in one sitting. Do not sip.  This drink was given to you during your hospital  pre-op appointment visit.  Nothing else to drink after completing the  Pre-Surgery Clear Ensure.         If you have questions, please contact your surgeon's office.   Take these medicines the morning of surgery with A SIP OF WATER: dorzolamide-timolol (COSOPT) ophthalmic solution    One week prior to surgery, STOP taking any Aspirin (unless otherwise instructed by your surgeon) Aleve, Naproxen, Ibuprofen, Motrin, Advil, Goody's, BC's, all herbal medications, fish oil, and non-prescription vitamins.                     Do NOT Smoke (Tobacco/Vaping) for 24 hours prior to your procedure.  If you use a CPAP at night, you may bring your mask/headgear for your overnight stay.   You will be asked to remove any contacts, glasses,  piercing's, hearing aid's, dentures/partials prior to surgery. Please bring cases for these items if needed.    Patients discharged the day of surgery will not be allowed to drive home, and someone needs to stay with them for 24 hours.  SURGICAL WAITING ROOM VISITATION Patients may have no more than 2 support people in the waiting area - these visitors may rotate.   Pre-op nurse will coordinate an appropriate time for 1 ADULT support person, who may not rotate, to accompany patient in pre-op.  Children under the age of 79 must have an adult with them who is not the patient and must remain in the main waiting area with an adult.  If the patient needs to stay at the hospital during part of their recovery, the visitor guidelines for inpatient rooms apply.  Please refer to the [facility] website for the visitor guidelines for any additional information.   If you received a COVID test during your pre-op visit  it is requested that you wear a mask when out in public, stay away from anyone that may not be feeling well and notify your surgeon if you develop symptoms. If you have been in contact with anyone that has tested positive in the last 10 days please notify you surgeon.      Pre-operative 5 CHG Bathing Instructions   You can play a key role in reducing the risk of infection after surgery. Your skin needs to be as free of germs  as possible. You can reduce the number of germs on your skin by washing with CHG (chlorhexidine gluconate) soap before surgery. CHG is an antiseptic soap that kills germs and continues to kill germs even after washing.   DO NOT use if you have an allergy to chlorhexidine/CHG or antibacterial soaps. If your skin becomes reddened or irritated, stop using the CHG and notify one of our RNs at [phone].   Please shower with the CHG soap starting 4 days before surgery using the following schedule:     Please keep in mind the following:  DO NOT shave, including legs  and underarms, starting the day of your first shower.   You may shave your face at any point before/day of surgery.  Place clean sheets on your bed the day you start using CHG soap. Use a clean washcloth (not used since being washed) for each shower. DO NOT sleep with pets once you start using the CHG.   CHG Shower Instructions:  Wash your face and private area with normal soap. If you choose to wash your hair, wash first with your normal shampoo.  After you use shampoo/soap, rinse your hair and body thoroughly to remove shampoo/soap residue.  Turn the water OFF and apply about 3 tablespoons (45 ml) of CHG soap to a CLEAN washcloth.  Apply CHG soap ONLY FROM YOUR NECK DOWN TO YOUR TOES (washing for 3-5 minutes)  DO NOT use CHG soap on face, private areas, open wounds, or sores.  Pay special attention to the area where your surgery is being performed.  If you are having back surgery, having someone wash your back for you may be helpful. Wait 2 minutes after CHG soap is applied, then you may rinse off the CHG soap.  Pat dry with a clean towel  Put on clean clothes/pajamas   If you choose to wear lotion, please use ONLY the CHG-compatible lotions on the back of this paper.   Additional instructions for the day of surgery: DO NOT APPLY any lotions, deodorants, cologne, or perfumes.   Do not bring valuables to the hospital. [facility] is not responsible for any belongings/valuables. Do not wear nail polish, gel polish, artificial nails, or any other type of covering on natural nails (fingers and toes) Do not wear jewelry or makeup Put on clean/comfortable clothes.  Please brush your teeth.  Ask your nurse before applying any prescription medications to the skin.     CHG Compatible Lotions   Aveeno Moisturizing lotion  Cetaphil Moisturizing Cream  Cetaphil Moisturizing Lotion  Clairol Herbal Essence Moisturizing Lotion, Dry Skin  Clairol Herbal Essence Moisturizing Lotion, Extra Dry  Skin  Clairol Herbal Essence Moisturizing Lotion, Normal Skin  Curel Age Defying Therapeutic Moisturizing Lotion with Alpha Hydroxy  Curel Extreme Care Body Lotion  Curel Soothing Hands Moisturizing Hand Lotion  Curel Therapeutic Moisturizing Cream, Fragrance-Free  Curel Therapeutic Moisturizing Lotion, Fragrance-Free  Curel Therapeutic Moisturizing Lotion, Original Formula  Eucerin Daily Replenishing Lotion  Eucerin Dry Skin Therapy Plus Alpha Hydroxy Crme  Eucerin Dry Skin Therapy Plus Alpha Hydroxy Lotion  Eucerin Original Crme  Eucerin Original Lotion  Eucerin Plus Crme Eucerin Plus Lotion  Eucerin TriLipid Replenishing Lotion  Keri Anti-Bacterial Hand Lotion  Keri Deep Conditioning Original Lotion Dry Skin Formula Softly Scented  Keri Deep Conditioning Original Lotion, Fragrance Free Sensitive Skin Formula  Keri Lotion Fast Absorbing Fragrance Free Sensitive Skin Formula  Keri Lotion Fast Absorbing Softly Scented Dry Skin Formula  Keri Original Lotion  Keri Skin Renewal Lotion Keri Silky Smooth Lotion  Keri Silky Smooth Sensitive Skin Lotion  Nivea Body Creamy Conditioning Oil  Nivea Body Extra Enriched Teacher, adult education Moisturizing Lotion Nivea Crme  Nivea Skin Firming Lotion  NutraDerm 30 Skin Lotion  NutraDerm Skin Lotion  NutraDerm Therapeutic Skin Cream  NutraDerm Therapeutic Skin Lotion  ProShield Protective Hand Cream  Provon moisturizing lotion  Please read over the following fact sheets that you were given.

## 2023-07-31 ENCOUNTER — Encounter (HOSPITAL_COMMUNITY): Payer: Self-pay

## 2023-07-31 ENCOUNTER — Other Ambulatory Visit: Payer: Self-pay

## 2023-07-31 ENCOUNTER — Encounter (HOSPITAL_COMMUNITY)
Admission: RE | Admit: 2023-07-31 | Discharge: 2023-07-31 | Disposition: A | Discharge status: Home / Self Care | Payer: Medicare Other | Source: Ambulatory Visit | Attending: Orthopedic Surgery | Admitting: Orthopedic Surgery

## 2023-07-31 VITALS — BP 135/88 | HR 72 | Temp 97.8°F | Resp 18 | Ht 72.0 in | Wt 237.5 lb

## 2023-07-31 DIAGNOSIS — Z01818 Encounter for other preprocedural examination: Secondary | ICD-10-CM | POA: Diagnosis present

## 2023-07-31 HISTORY — DX: Personal history of other diseases of the digestive system: Z87.19

## 2023-07-31 LAB — CBC
HCT: 42.9 % (ref 39.0–52.0)
Hemoglobin: 14 g/dL (ref 13.0–17.0)
MCH: 27.3 pg (ref 26.0–34.0)
MCHC: 32.6 g/dL (ref 30.0–36.0)
MCV: 83.8 fL (ref 80.0–100.0)
Platelets: 259 10*3/uL (ref 150–400)
RBC: 5.12 MIL/uL (ref 4.22–5.81)
RDW: 13.1 % (ref 11.5–15.5)
WBC: 5.2 10*3/uL (ref 4.0–10.5)
nRBC: 0 % (ref 0.0–0.2)

## 2023-07-31 LAB — BASIC METABOLIC PANEL
Anion gap: 9 (ref 5–15)
BUN: 16 mg/dL (ref 8–23)
CO2: 24 mmol/L (ref 22–32)
Calcium: 8.9 mg/dL (ref 8.9–10.3)
Chloride: 104 mmol/L (ref 98–111)
Creatinine, Ser: 1.09 mg/dL (ref 0.61–1.24)
GFR, Estimated: >60 mL/min (ref >60)
Glucose, Bld: 105 mg/dL — ABNORMAL HIGH (ref 70–99)
Potassium: 4.2 mmol/L (ref 3.5–5.1)
Sodium: 137 mmol/L (ref 135–145)

## 2023-07-31 LAB — TYPE AND SCREEN
ABO/RH(D): B POS
Antibody Screen: NEGATIVE

## 2023-07-31 LAB — SURGICAL PCR SCREEN
MRSA, PCR: POSITIVE — AB
Staphylococcus aureus: POSITIVE — AB

## 2023-07-31 NOTE — Progress Notes (Signed)
PCP - Dr. Charolette Child (Newly established. First visit is 08/27/23. Previous PCP was in same practice) Cardiologist - Denies  PPM/ICD - Denies Device Orders - n/a Rep Notified - n/a  Chest x-ray - 01/13/2023 (CE) EKG - 4-5 years ago Stress Test - Denies ECHO - Denies Cardiac Cath - Denies  Sleep Study - Denies CPAP - n/a  No DM  Last dose of GLP1 agonist- n/a GLP1 instructions: n/a  Blood Thinner Instructions: n/a Aspirin Instructions: n/a  ERAS Protcol - Clear liquids until 1000 morning of surgery PRE-SURGERY Ensure or G2- Ensure given to pt with instructions  COVID TEST- n/a   Anesthesia review: No   Patient denies shortness of breath, fever, cough and chest pain at PAT appointment. Pt denies any respiratory illness/infection in the last two months.   All instructions explained to the patient, with a verbal understanding of the material. Patient agrees to go over the instructions while at home for a better understanding. Patient also instructed to self quarantine after being tested for COVID-19. The opportunity to ask questions was provided.

## 2023-07-31 NOTE — Progress Notes (Signed)
Lupita Leash, [place] scheduler with Dr. Marshell Levan office, made aware of patients +MRSA and +MSSA surgical PCR.

## 2023-08-07 ENCOUNTER — Inpatient Hospital Stay (HOSPITAL_COMMUNITY)
Admission: RE | Disposition: A | Discharge status: Home / Self Care | Payer: Self-pay | Source: Home / Self Care | Attending: Orthopedic Surgery

## 2023-08-07 ENCOUNTER — Inpatient Hospital Stay (HOSPITAL_COMMUNITY): Payer: Medicare Other | Admitting: Anesthesiology

## 2023-08-07 ENCOUNTER — Inpatient Hospital Stay (HOSPITAL_COMMUNITY)
Admission: RE | Admit: 2023-08-07 | Discharge: 2023-08-07 | DRG: 029 | Disposition: A | Discharge status: Home / Self Care | Payer: Medicare Other | Attending: Orthopedic Surgery | Admitting: Orthopedic Surgery

## 2023-08-07 ENCOUNTER — Inpatient Hospital Stay (HOSPITAL_COMMUNITY): Payer: Medicare Other

## 2023-08-07 ENCOUNTER — Other Ambulatory Visit: Payer: Self-pay

## 2023-08-07 ENCOUNTER — Encounter (HOSPITAL_COMMUNITY): Payer: Self-pay | Admitting: Orthopedic Surgery

## 2023-08-07 DIAGNOSIS — Z96653 Presence of artificial knee joint, bilateral: Secondary | ICD-10-CM | POA: Diagnosis present

## 2023-08-07 DIAGNOSIS — Z6832 Body mass index (BMI) 32.0-32.9, adult: Secondary | ICD-10-CM | POA: Diagnosis not present

## 2023-08-07 DIAGNOSIS — M47012 Anterior spinal artery compression syndromes, cervical region: Secondary | ICD-10-CM

## 2023-08-07 DIAGNOSIS — Z96612 Presence of left artificial shoulder joint: Secondary | ICD-10-CM | POA: Diagnosis present

## 2023-08-07 DIAGNOSIS — E669 Obesity, unspecified: Secondary | ICD-10-CM | POA: Diagnosis present

## 2023-08-07 DIAGNOSIS — G959 Disease of spinal cord, unspecified: Secondary | ICD-10-CM | POA: Diagnosis present

## 2023-08-07 DIAGNOSIS — M1990 Unspecified osteoarthritis, unspecified site: Secondary | ICD-10-CM | POA: Diagnosis present

## 2023-08-07 DIAGNOSIS — M5412 Radiculopathy, cervical region: Secondary | ICD-10-CM | POA: Diagnosis present

## 2023-08-07 DIAGNOSIS — Z89021 Acquired absence of right finger(s): Secondary | ICD-10-CM

## 2023-08-07 DIAGNOSIS — G9520 Unspecified cord compression: Secondary | ICD-10-CM | POA: Diagnosis present

## 2023-08-07 DIAGNOSIS — Z800 Family history of malignant neoplasm of digestive organs: Secondary | ICD-10-CM

## 2023-08-07 DIAGNOSIS — Z79899 Other long term (current) drug therapy: Secondary | ICD-10-CM | POA: Diagnosis not present

## 2023-08-07 DIAGNOSIS — G9589 Other specified diseases of spinal cord: Secondary | ICD-10-CM | POA: Diagnosis present

## 2023-08-07 DIAGNOSIS — M4712 Other spondylosis with myelopathy, cervical region: Principal | ICD-10-CM

## 2023-08-07 HISTORY — PX: ANTERIOR CERVICAL DECOMPRESSION/DISCECTOMY FUSION 4 LEVELS: SHX5556

## 2023-08-07 SURGERY — ANTERIOR CERVICAL DECOMPRESSION/DISCECTOMY FUSION 4 LEVELS
Anesthesia: General | Site: Spine Cervical

## 2023-08-07 MED ORDER — MINERAL OIL LIGHT OIL
TOPICAL_OIL | Status: DC | PRN
  Administered 2023-08-07: 1 via TOPICAL

## 2023-08-07 MED ORDER — GLYCOPYRROLATE 0.2 MG/ML IJ SOLN
INTRAMUSCULAR | Status: DC | PRN
  Administered 2023-08-07: .2 mg via INTRAVENOUS

## 2023-08-07 MED ORDER — MUPIROCIN 2 % EX OINT: 1.0000 | TOPICAL_OINTMENT | Freq: Two times a day (BID) | CUTANEOUS | Status: DC

## 2023-08-07 MED ORDER — BUPIVACAINE-EPINEPHRINE 0.25% -1:200000 IJ SOLN
INTRAMUSCULAR | Status: DC | PRN
  Administered 2023-08-07: 8 mL

## 2023-08-07 MED ORDER — ALBUMIN HUMAN 5 % IV SOLN: INTRAVENOUS | Status: DC | PRN

## 2023-08-07 MED ORDER — HYDROMORPHONE HCL 1 MG/ML IJ SOLN
0.2500 mg | INTRAMUSCULAR | Status: DC | PRN
  Administered 2023-08-07: 0.25 mg via INTRAVENOUS

## 2023-08-07 MED ORDER — FENTANYL CITRATE (PF) 250 MCG/5ML IJ SOLN
INTRAMUSCULAR | Status: AC
  Filled 2023-08-07: qty 5

## 2023-08-07 MED ORDER — HYDROCODONE-ACETAMINOPHEN 5-325 MG PO TABS
1.0000 | ORAL_TABLET | Freq: Four times a day (QID) | ORAL | 0 refills | Status: DC | PRN
Start: 2023-08-07 — End: 2023-12-19

## 2023-08-07 MED ORDER — LACTATED RINGERS IV SOLN: INTRAVENOUS | Status: DC

## 2023-08-07 MED ORDER — HYDROMORPHONE HCL 1 MG/ML IJ SOLN
INTRAMUSCULAR | Status: AC
  Filled 2023-08-07: qty 1

## 2023-08-07 MED ORDER — DEXAMETHASONE SODIUM PHOSPHATE 10 MG/ML IJ SOLN
INTRAMUSCULAR | Status: AC
  Filled 2023-08-07: qty 1

## 2023-08-07 MED ORDER — SUCCINYLCHOLINE CHLORIDE 200 MG/10ML IV SOSY: PREFILLED_SYRINGE | INTRAVENOUS | Status: DC | PRN

## 2023-08-07 MED ORDER — MIDAZOLAM HCL 2 MG/2ML IJ SOLN
INTRAMUSCULAR | Status: AC
Start: 2023-08-07 — End: ?
  Filled 2023-08-07: qty 2

## 2023-08-07 MED ORDER — LACTATED RINGERS IV SOLN: INTRAVENOUS | Status: DC | PRN

## 2023-08-07 MED ORDER — PHENYLEPHRINE HCL-NACL 20-0.9 MG/250ML-% IV SOLN
INTRAVENOUS | Status: DC | PRN
  Administered 2023-08-07: 40 ug/min via INTRAVENOUS

## 2023-08-07 MED ORDER — AMISULPRIDE (ANTIEMETIC) 5 MG/2ML IV SOLN: 10.0000 mg | Freq: Once | INTRAVENOUS | Status: DC | PRN

## 2023-08-07 MED ORDER — POVIDONE-IODINE 7.5 % EX SOLN: Freq: Once | CUTANEOUS | Status: DC

## 2023-08-07 MED ORDER — METHOCARBAMOL 1000 MG/10ML IJ SOLN
INTRAMUSCULAR | Status: AC
  Administered 2023-08-07: 1000 mg
  Filled 2023-08-07: qty 10

## 2023-08-07 MED ORDER — SODIUM CHLORIDE 0.9 % IV SOLN: 12.5000 mg | INTRAVENOUS | Status: DC | PRN

## 2023-08-07 MED ORDER — ORAL CARE MOUTH RINSE: 15.0000 mL | Freq: Once | OROMUCOSAL | Status: AC

## 2023-08-07 MED ORDER — 0.9 % SODIUM CHLORIDE (POUR BTL) OPTIME
TOPICAL | Status: DC | PRN
  Administered 2023-08-07: 1000 mL

## 2023-08-07 MED ORDER — EPHEDRINE SULFATE-NACL 50-0.9 MG/10ML-% IV SOSY
PREFILLED_SYRINGE | INTRAVENOUS | Status: DC | PRN
  Administered 2023-08-07 (×2): 5 mg via INTRAVENOUS

## 2023-08-07 MED ORDER — METHOCARBAMOL 750 MG PO TABS: 750.0000 mg | ORAL_TABLET | Freq: Four times a day (QID) | ORAL | 0 refills | Status: DC | PRN

## 2023-08-07 MED ORDER — PHENYLEPHRINE 80 MCG/ML (10ML) SYRINGE FOR IV PUSH (FOR BLOOD PRESSURE SUPPORT)
PREFILLED_SYRINGE | INTRAVENOUS | Status: DC | PRN
  Administered 2023-08-07: 80 ug via INTRAVENOUS
  Administered 2023-08-07: 120 ug via INTRAVENOUS
  Administered 2023-08-07: 40 ug via INTRAVENOUS
  Administered 2023-08-07 (×3): 80 ug via INTRAVENOUS

## 2023-08-07 MED ORDER — CEFAZOLIN SODIUM-DEXTROSE 2-4 GM/100ML-% IV SOLN
2.0000 g | INTRAVENOUS | Status: AC
  Administered 2023-08-07: 2 g via INTRAVENOUS
  Filled 2023-08-07: qty 100

## 2023-08-07 MED ORDER — FENTANYL CITRATE (PF) 250 MCG/5ML IJ SOLN
INTRAMUSCULAR | Status: DC | PRN
  Administered 2023-08-07: 50 ug via INTRAVENOUS
  Administered 2023-08-07: 100 ug via INTRAVENOUS

## 2023-08-07 MED ORDER — METHOCARBAMOL 1000 MG/10ML IJ SOLN: 1000.0000 mg | Freq: Once | INTRAMUSCULAR | Status: DC

## 2023-08-07 MED ORDER — OXYCODONE HCL 5 MG PO TABS: 5.0000 mg | ORAL_TABLET | Freq: Once | ORAL | Status: DC | PRN

## 2023-08-07 MED ORDER — LIDOCAINE 2% (20 MG/ML) 5 ML SYRINGE
INTRAMUSCULAR | Status: DC | PRN
  Administered 2023-08-07: 100 mg via INTRAVENOUS

## 2023-08-07 MED ORDER — MINERAL OIL LIGHT OIL
TOPICAL_OIL | Freq: Once | Status: DC
  Filled 2023-08-07: qty 10

## 2023-08-07 MED ORDER — PROPOFOL 10 MG/ML IV BOLUS
INTRAVENOUS | Status: DC | PRN
  Administered 2023-08-07: 150 mg via INTRAVENOUS

## 2023-08-07 MED ORDER — ONDANSETRON HCL 4 MG/2ML IJ SOLN
INTRAMUSCULAR | Status: DC | PRN
  Administered 2023-08-07: 4 mg via INTRAVENOUS

## 2023-08-07 MED ORDER — CHLORHEXIDINE GLUCONATE 0.12 % MT SOLN
15.0000 mL | Freq: Once | OROMUCOSAL | Status: AC
  Administered 2023-08-07: 15 mL via OROMUCOSAL
  Filled 2023-08-07: qty 15

## 2023-08-07 MED ORDER — CHLORHEXIDINE GLUCONATE CLOTH 2 % EX PADS: 6.0000 | MEDICATED_PAD | Freq: Every day | CUTANEOUS | Status: DC

## 2023-08-07 MED ORDER — DEXAMETHASONE SODIUM PHOSPHATE 10 MG/ML IJ SOLN
INTRAMUSCULAR | Status: DC | PRN
  Administered 2023-08-07: 5 mg via INTRAVENOUS
  Administered 2023-08-07: 10 mg via INTRAVENOUS

## 2023-08-07 MED ORDER — BUPIVACAINE-EPINEPHRINE (PF) 0.25% -1:200000 IJ SOLN
INTRAMUSCULAR | Status: AC
Start: 2023-08-07 — End: ?
  Filled 2023-08-07: qty 30

## 2023-08-07 MED ORDER — THROMBIN 20000 UNITS EX KIT
PACK | CUTANEOUS | Status: DC | PRN
  Administered 2023-08-07: 20 mL via TOPICAL

## 2023-08-07 MED ORDER — MIDAZOLAM HCL 2 MG/2ML IJ SOLN
INTRAMUSCULAR | Status: DC | PRN
  Administered 2023-08-07: 2 mg via INTRAVENOUS

## 2023-08-07 MED ORDER — HYDROMORPHONE HCL 1 MG/ML IJ SOLN
INTRAMUSCULAR | Status: DC | PRN
Start: 2023-08-07 — End: 2023-08-07
  Administered 2023-08-07: .5 mg via INTRAVENOUS

## 2023-08-07 MED ORDER — ROCURONIUM BROMIDE 10 MG/ML (PF) SYRINGE
PREFILLED_SYRINGE | INTRAVENOUS | Status: DC | PRN
  Administered 2023-08-07 (×2): 20 mg via INTRAVENOUS
  Administered 2023-08-07: 100 mg via INTRAVENOUS
  Administered 2023-08-07: 20 mg via INTRAVENOUS

## 2023-08-07 MED ORDER — OXYCODONE HCL 5 MG/5ML PO SOLN: 5.0000 mg | Freq: Once | ORAL | Status: DC | PRN

## 2023-08-07 MED ORDER — SUGAMMADEX SODIUM 200 MG/2ML IV SOLN
INTRAVENOUS | Status: DC | PRN
  Administered 2023-08-07: 100 mg via INTRAVENOUS
  Administered 2023-08-07: 200 mg via INTRAVENOUS

## 2023-08-07 MED ORDER — THROMBIN 20000 UNITS EX SOLR
CUTANEOUS | Status: AC
Start: 2023-08-07 — End: ?
  Filled 2023-08-07: qty 20000

## 2023-08-07 MED ORDER — HYDROMORPHONE HCL 1 MG/ML IJ SOLN
INTRAMUSCULAR | Status: AC
  Filled 2023-08-07: qty 0.5

## 2023-08-07 MED ORDER — PROPOFOL 10 MG/ML IV BOLUS
INTRAVENOUS | Status: AC
  Filled 2023-08-07: qty 20

## 2023-08-07 SURGICAL SUPPLY — 70 items
BAG COUNTER SPONGE SURGICOUNT (BAG) ×1 IMPLANT
BENZOIN TINCTURE PRP APPL 2/3 (GAUZE/BANDAGES/DRESSINGS) ×1 IMPLANT
BIT DRILL NEURO 2X3.1 SFT TUCH (MISCELLANEOUS) ×1 IMPLANT
BIT DRILL SRG 14X2.2XFLT CHK (BIT) IMPLANT
BIT DRL SRG 14X2.2XFLT CHK (BIT) ×1
BLADE CLIPPER SURG (BLADE) ×1 IMPLANT
BLADE SURG 15 STRL LF DISP TIS (BLADE) ×1 IMPLANT
BONE VIVIGEN FORMABLE 1.3CC (Bone Implant) ×1 IMPLANT
COVER SURGICAL LIGHT HANDLE (MISCELLANEOUS) ×1 IMPLANT
DEVICE ENDSKLTN CRVCL 5MM-0SM (Orthopedic Implant) IMPLANT
DRAIN JACKSON RD 7FR 3/32 (WOUND CARE) IMPLANT
DRAPE C-ARM 42X72 X-RAY (DRAPES) ×1 IMPLANT
DRAPE LAPAROTOMY 100X72X124 (DRAPES) IMPLANT
DRAPE POUCH INSTRU U-SHP 10X18 (DRAPES) ×1 IMPLANT
DRAPE SURG 17X23 STRL (DRAPES) ×3 IMPLANT
DRILL NEURO 2X3.1 SOFT TOUCH (MISCELLANEOUS) ×2
DURAPREP 26ML APPLICATOR (WOUND CARE) ×1 IMPLANT
ELECT COATED BLADE 2.86 ST (ELECTRODE) ×1 IMPLANT
ELECT REM PT RETURN 9FT ADLT (ELECTROSURGICAL) ×1
ELECTRODE REM PT RTRN 9FT ADLT (ELECTROSURGICAL) ×1 IMPLANT
ENDOSKELETON CERVICAL 5MM-0SM (Orthopedic Implant) ×1 IMPLANT
EVACUATOR SILICONE 100CC (DRAIN) IMPLANT
GAUZE 4X4 16PLY ~~LOC~~+RFID DBL (SPONGE) ×1 IMPLANT
GAUZE SPONGE 4X4 12PLY STRL (GAUZE/BANDAGES/DRESSINGS) ×1 IMPLANT
GLOVE BIO SURGEON STRL SZ 6.5 (GLOVE) ×1 IMPLANT
GLOVE BIO SURGEON STRL SZ8 (GLOVE) ×1 IMPLANT
GLOVE BIOGEL PI IND STRL 7.0 (GLOVE) ×2 IMPLANT
GLOVE BIOGEL PI IND STRL 8 (GLOVE) ×1 IMPLANT
GLOVE SURG ENC MOIS LTX SZ6.5 (GLOVE) ×1 IMPLANT
GOWN STRL REUS W/ TWL LRG LVL3 (GOWN DISPOSABLE) ×1 IMPLANT
GOWN STRL REUS W/ TWL XL LVL3 (GOWN DISPOSABLE) ×1 IMPLANT
GRAFT BNE MATRIX VG FRMBL SM 1 (Bone Implant) IMPLANT
INTERLOCK LRDTC CRVCL VBR SM (Bone Implant) IMPLANT
IV CATH 14GX2 1/4 (CATHETERS) ×1 IMPLANT
KIT BASIN OR (CUSTOM PROCEDURE TRAY) ×1 IMPLANT
KIT TURNOVER KIT B (KITS) ×1 IMPLANT
LORDOTIC CERVICAL VBR SM 5MM (Bone Implant) ×2 IMPLANT
MANIFOLD NEPTUNE II (INSTRUMENTS) IMPLANT
NDL PRECISIONGLIDE 27X1.5 (NEEDLE) ×1 IMPLANT
NDL SPNL 20GX3.5 QUINCKE YW (NEEDLE) ×1 IMPLANT
NEEDLE PRECISIONGLIDE 27X1.5 (NEEDLE) ×1
NEEDLE SPNL 20GX3.5 QUINCKE YW (NEEDLE) ×1
NS IRRIG 1000ML POUR BTL (IV SOLUTION) ×1 IMPLANT
PACK ORTHO CERVICAL (CUSTOM PROCEDURE TRAY) ×1 IMPLANT
PAD ARMBOARD 7.5X6 YLW CONV (MISCELLANEOUS) ×2 IMPLANT
PATTIES SURGICAL .5 X.5 (GAUZE/BANDAGES/DRESSINGS) IMPLANT
PATTIES SURGICAL .5 X1 (DISPOSABLE) IMPLANT
PIN DISTRACTION 14 (PIN) IMPLANT
PLATE SKYLINE 3LVL 42MM (Plate) IMPLANT
POSITIONER HEAD DONUT 9IN (MISCELLANEOUS) ×1 IMPLANT
SCREW SKYLINE VAR OS 14MM (Screw) IMPLANT
SCREW VAR SELF TAP SKYLINE 14M (Screw) IMPLANT
SPIKE FLUID TRANSFER (MISCELLANEOUS) ×1 IMPLANT
SPONGE INTESTINAL PEANUT (DISPOSABLE) ×2 IMPLANT
SPONGE SURGIFOAM ABS GEL 100 (HEMOSTASIS) ×1 IMPLANT
STRIP CLOSURE SKIN 1/2X4 (GAUZE/BANDAGES/DRESSINGS) ×1 IMPLANT
SURGIFLO W/THROMBIN 8M KIT (HEMOSTASIS) IMPLANT
SUT MNCRL AB 4-0 PS2 18 (SUTURE) ×1 IMPLANT
SUT VIC AB 2-0 CT2 18 VCP726D (SUTURE) ×1 IMPLANT
SYR BULB IRRIG 60ML STRL (SYRINGE) ×1 IMPLANT
SYR CONTROL 10ML LL (SYRINGE) ×2 IMPLANT
SYR TB 1ML LUER SLIP (SYRINGE) IMPLANT
TAPE CLOTH 4X10 WHT NS (GAUZE/BANDAGES/DRESSINGS) ×1 IMPLANT
TAPE CLOTH SURG 4X10 WHT LF (GAUZE/BANDAGES/DRESSINGS) IMPLANT
TAPE UMBILICAL 1/8X30 (MISCELLANEOUS) ×2 IMPLANT
TOWEL GREEN STERILE (TOWEL DISPOSABLE) ×1 IMPLANT
TOWEL GREEN STERILE FF (TOWEL DISPOSABLE) ×1 IMPLANT
TRAY FOLEY MTR SLVR 16FR STAT (SET/KITS/TRAYS/PACK) ×1 IMPLANT
WATER STERILE IRR 1000ML POUR (IV SOLUTION) ×1 IMPLANT
YANKAUER SUCT BULB TIP NO VENT (SUCTIONS) ×1 IMPLANT

## 2023-08-07 NOTE — Anesthesia Procedure Notes (Signed)
Procedure Name: Intubation Date/Time: 08/07/2023 2:30 PM  Performed by: Halina Andreas, CRNAPre-anesthesia Checklist: Patient identified, Emergency Drugs available, Suction available, Patient being monitored and Timeout performed Patient Re-evaluated:Patient Re-evaluated prior to induction Oxygen Delivery Method: Circle system utilized Preoxygenation: Pre-oxygenation with 100% oxygen Induction Type: IV induction Ventilation: Mask ventilation without difficulty Laryngoscope Size: Glidescope and 4 Grade View: Grade I Tube type: Oral Tube size: 7.5 mm Number of attempts: 1 Airway Equipment and Method: Stylet

## 2023-08-07 NOTE — H&P (Signed)
PREOPERATIVE H&P  Chief Complaint: Left arm numbness  HPI: Anthony Kane is a 72 y.o. male who presents with ongoing left arm numbess  MRI reveals spinal cord compression and myelomalacia spanning C4-C7  Patient has failed multiple forms of conservative care and continues to have pain (see office notes for additional details regarding the patient's full course of treatment)  Past Medical History:  Diagnosis Date   Allergy    Arthritis    Shoulder and thumb   Cataract    History of blood transfusion 1983   History of hiatal hernia    Prediabetes    i was but i changed my diet and had a another check and they did say anything about it , denies    Past Surgical History:  Procedure Laterality Date   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   BUNIONECTOMY Left    BUNIONECTOMY Right    COLONOSCOPY  2019   EYE SURGERY Bilateral    intraocular lens- cataract   HAMMER TOE SURGERY Right    HAND SURGERY Right    x8   KNEE ARTHROPLASTY Right 2010   KNEE ARTHROSCOPY Bilateral    x2 on each knee   SHOULDER OPEN ROTATOR CUFF REPAIR Right 05/08/2013   Procedure: RIGHT SHOULDER OPEN ROTATOR CUFF REPAIR with ANCHORS;  Surgeon: Jacki Cones, MD;  Location: [facility];  Service: Orthopedics;  Laterality: Right;   TONSILLECTOMY  age 68   TOTAL KNEE ARTHROPLASTY Left 05/26/2019   Procedure: LEFT TOTAL KNEE ARTHROPLASTY;  Surgeon: Marcene Corning, MD;  Location: [facility];  Service: Orthopedics;  Laterality: Left;   TOTAL SHOULDER ARTHROPLASTY Left 08/17/2016   Procedure: left reverse shoulder arthroplasty;  Surgeon: Beverely Low, MD;  Location: [facility] OR;  Service: Orthopedics;  Laterality: Left;   Social History   Socioeconomic History   Marital status: Married    Spouse name: Not on file   Number of children: Not on file   Years of education: Not on file   Highest education level: Not on file  Occupational History   Not on file  Tobacco Use   Smoking status: Never    Smokeless tobacco: Never  Vaping Use   Vaping status: Never Used  Substance and Sexual Activity   Alcohol use: Yes    Comment: rarely maybe 1 time a month   Drug use: No   Sexual activity: Yes  Other Topics Concern   Not on file  Social History Narrative   Not on file   Social Determinants of Health   Financial Resource Strain: Low Risk  (09/19/2022)   Received from [facility], [facility]   Overall Financial Resource Strain (CARDIA)    Difficulty of Paying Living Expenses: Not very hard  Food Insecurity: No Food Insecurity (01/14/2023)   Received from [facility], [facility]   Hunger Vital Sign    Worried About Running Out of Food in the Last Year: Never true    Ran Out of Food in the Last Year: Never true  Transportation Needs: No Transportation Needs (01/14/2023)   Received from [facility], [facility]   PRAPARE - Transportation    Lack of Transportation (Medical): No    Lack of Transportation (Non-Medical): No  Physical Activity: Sufficiently Active (09/19/2022)   Received from [facility], [facility]   Exercise Vital Sign    Days of Exercise per Week: 4 days    Minutes of Exercise per Session: 80 min  Recent  Concern: Physical Activity - Insufficiently Active (09/05/2022)   Received from [facility]   Exercise Vital Sign    Days of Exercise per Week: 3 days    Minutes of Exercise per Session: 30 min  Stress: No Stress Concern Present (09/19/2022)   Received from [facility], [facility] - Occupational Stress Questionnaire    Feeling of Stress : Not at all  Social Connections: Moderately Integrated (09/19/2022)   Received from [facility], [facility]   Social Network    How would you rate your social network (family, work, friends)?: Adequate participation with social networks   Family History  Problem Relation Age of Onset   Esophageal cancer Brother    Colon cancer Neg Hx    Rectal cancer Neg Hx    Stomach  cancer Neg Hx    No Known Allergies Prior to Admission medications   Medication Sig Start Date End Date Taking? Authorizing Provider  acetaminophen (TYLENOL) 500 MG tablet Take 500 mg by mouth every 6 (six) hours as needed for moderate pain (pain score 4-6).   Yes [provider]  dorzolamide-timolol (COSOPT) 2-0.5 % ophthalmic solution Place 1 drop into the right eye 2 (two) times daily.   Yes [provider]  fexofenadine (ALLEGRA) 180 MG tablet Take 180 mg by mouth as needed for allergies or rhinitis (itching).   Yes [provider]  Linoleic Acid-Sunflower Oil (CLA PO) Take 1 capsule by mouth daily. Conjugated linoleic acid   Yes [provider]  Multiple Vitamin (MULTIVITAMIN WITH MINERALS) TABS tablet Take 3 tablets by mouth at bedtime. Catalyn Chewable by Standard Process   Yes [provider]  Multiple Vitamins-Minerals (PRESERVISION AREDS 2 PO) Take 1 capsule by mouth in the morning and at bedtime.   Yes [provider]  naproxen sodium (ALEVE) 220 MG tablet Take 440 mg by mouth in the morning.   Yes [provider]  Saw Palmetto, Serenoa repens, (SAW PALMETTO PO) Take 1 capsule by mouth daily.   Yes [provider]  aspirin 81 MG chewable tablet Chew 1 tablet (81 mg total) by mouth 2 (two) times daily. Patient not taking: Reported on 01/18/2023 05/27/19   Elodia Florence, PA-C  HYDROcodone-acetaminophen (NORCO/VICODIN) 5-325 MG tablet Take 1-2 tablets by mouth every 6 (six) hours as needed for moderate pain (pain score 4-6). Patient not taking: Reported on 06/10/2019 05/27/19   Elodia Florence, PA-C  sildenafil (REVATIO) 20 MG tablet Take 20 mg by mouth daily as needed (erectile dysfunction).     [provider]     All other systems have been reviewed and were otherwise negative with the exception of those mentioned in the HPI and as above.  Physical Exam: Vitals:   08/07/23 1103  BP: (!) 150/85  Pulse: 78   Resp: 18  Temp: 98.9 F (37.2 C)  SpO2: 95%    Body mass index is 32.21 kg/m.  General: Alert, no acute distress Cardiovascular: No pedal edema Respiratory: No cyanosis, no use of accessory musculature Skin: No lesions in the area of chief complaint Neurologic: Sensation intact distally Psychiatric: Patient is competent for consent with normal mood and affect Lymphatic: No axillary or cervical lymphadenopathy   Assessment/Plan: Spinal cord compression with myelomalacia C4-C7 Plan for Procedure(s): ANTERIOR CERVICAL DECOMPRESSION FUSION CERVICAL 4- CERVICAL 5, CERVICAL 5- CERVICAL 6, CERVICAL 6- CERVICAL 7 WITH INSTRUMENTATION AND ALLOGRAFT   Jackelyn Hoehn, MD 08/07/2023 12:35 PM

## 2023-08-07 NOTE — Op Note (Signed)
PATIENT NAME: Anthony Kane   MEDICAL RECORD NO.:   [id number]    DATE OF BIRTH: [birth year]   DATE OF PROCEDURE: 08/07/2023                                OPERATIVE REPORT     PREOPERATIVE DIAGNOSES: 1.  Cervical myelopathy 2.  Spinal cord compression, including myelomalacia spanning C4-C7   POSTOPERATIVE DIAGNOSES: 1.  Cervical myelopathy 2.  Spinal cord compression, including myelomalacia spanning C4-C7   PROCEDURE: 1. Anterior cervical decompression and fusion C4/5, C5/6, C6/7. 2. Placement of anterior instrumentation, C4-C7. 3. Insertion of interbody device x 3 (Titan intervertebral spacers). 4. Intraoperative use of fluoroscopy. 5. Use of morselized allograft - ViviGen.   SURGEON:  Estill Bamberg, MD   ASSISTANT:  Jason Coop, PA-C.   ANESTHESIA:  General endotracheal anesthesia.   COMPLICATIONS:  None.   DISPOSITION:  Stable.   ESTIMATED BLOOD LOSS:  Minimal.   INDICATIONS FOR SURGERY:  Briefly, Anthony Kane is a pleasant 72 y.o. year- old male, who did present to me with severe pain in the neck and numbness in his left arm.  The patient's MRI did reveal the findings noted above.  Given the prominent MRI findings, clearly notable for cord compression and myelomalacia, we did discuss proceeding with the procedure noted above.  The patient was fully aware of the risks and limitations of surgery as outlined in my preoperative note.   OPERATIVE DETAILS:  On 08/07/2023, the patient was brought to surgery and general endotracheal anesthesia was administered.  The patient was placed supine on the hospital bed. The neck was gently extended.  All bony prominences were meticulously padded.  The neck was prepped and draped in the usual sterile fashion.  At this point, I did make a left-sided transverse incision.  The platysma was incised.  A Smith-Robinson approach was used and the anterior spine was identified. A self-retaining retractor was placed.  I then  subperiosteally exposed the vertebral bodies from C4-C7.  Caspar pins were then placed into the C6 and C7 vertebral bodies and distraction was applied.  A thorough and complete C6-7 intervertebral diskectomy was performed.  The posterior longitudinal ligament was identified and entered using a nerve hook.  I then used #1 followed by #2 Kerrison to perform a thorough and complete intervertebral diskectomy.  The spinal canal was thoroughly decompressed, as was the right and left neuroforamen.  The endplates were then prepared and the appropriate-sized intervertebral spacer was then packed with ViviGen and tamped into position in the usual fashion.  The lower Caspar pin was then removed and placed into the C5 vertebral body and once again, distraction was applied across the C5-6 intervertebral space.  I then again performed a thorough and complete diskectomy, thoroughly decompressing the spinal canal and bilateral neuroforamena.  After preparing the endplates, the appropriate-sized intervertebral spacer was packed with ViviGen and tamped into position.  The lower Caspar pin was then removed and placed into the C4 vertebral body and once again, distraction was applied across the C4-5 intervertebral space.  I then again performed a thorough and complete diskectomy, thoroughly decompressing the spinal canal and bilateral neuroforamena.  After preparing the endplates, the appropriate-sized intervertebral spacer was packed with ViviGen and tamped into position.  The Caspar pins then were removed and bone wax was placed in their place.  The appropriate-sized anterior cervical plate was placed over the anterior spine.  14 mm variable angle screws were placed, 2 in each vertebral body from C4-C7 for a total of 8 vertebral body screws.  The screws were then locked to the plate using the Cam locking mechanism.  I was very pleased with the final fluoroscopic images.  The wound was then irrigated.  The  wound was then explored for any undue bleeding and there was no bleeding noted. The wound was then closed in layers using 2-0 Vicryl, followed by 4-0 Monocryl.  Benzoin and Steri-Strips were applied, followed by sterile dressing.  All instrument counts were correct at the termination of the procedure.   Of note, Jason Coop, PA-C, was my assistant throughout surgery, and did aid in retraction, suctioning, placement of the hardware, and closure from start to finish.     Estill Bamberg, MD

## 2023-08-07 NOTE — Transfer of Care (Signed)
Immediate Anesthesia Transfer of Care Note  Patient: Anthony Kane  Procedure(s) Performed: ANTERIOR CERVICAL DECOMPRESSION FUSION CERVICAL 4- CERVICAL 5, CERVICAL 5- CERVICAL 6, CERVICAL 6- CERVICAL 7 WITH INSTRUMENTATION AND ALLOGRAFT (Spine Cervical)  Patient Location: PACU  Anesthesia Type:General  Level of Consciousness: awake, alert , and oriented  Airway & Oxygen Therapy: Patient Spontanous Breathing and Patient connected to face mask oxygen  Post-op Assessment: Report given to RN and Post -op Vital signs reviewed and stable  Post vital signs: Reviewed and stable  Last Vitals:  Vitals Value Taken Time  BP 128/67   Temp    Pulse 87 08/07/23 1743  Resp 19 08/07/23 1743  SpO2 96 % 08/07/23 1743  Vitals shown include unfiled device data.  Last Pain:  Vitals:   08/07/23 1103  TempSrc: Oral  PainSc: 0-No pain         Complications: No notable events documented.

## 2023-08-07 NOTE — Anesthesia Preprocedure Evaluation (Signed)
Anesthesia Evaluation  Patient identified by MRN, date of birth, ID band Patient awake    Reviewed: Allergy & Precautions, H&P , NPO status , Patient's Chart, lab work & pertinent test results, reviewed documented beta blocker date and time   Airway Mallampati: II  TM Distance: >3 FB Neck ROM: full    Dental no notable dental hx. (+) Poor Dentition, Dental Advisory Given   Pulmonary neg pulmonary ROS   Pulmonary exam normal breath sounds clear to auscultation       Cardiovascular negative cardio ROS  Rhythm:regular Rate:Normal     Neuro/Psych negative neurological ROS  negative psych ROS   GI/Hepatic negative GI ROS, Neg liver ROS,,,  Endo/Other  negative endocrine ROS    Renal/GU negative Renal ROS  negative genitourinary   Musculoskeletal negative musculoskeletal ROS (+) Arthritis , Osteoarthritis,    Abdominal  (+) + obese  Peds negative pediatric ROS (+)  Hematology negative hematology ROS (+)   Anesthesia Other Findings   Reproductive/Obstetrics negative OB ROS                             Anesthesia Physical Anesthesia Plan  ASA: II  Anesthesia Plan: General   Post-op Pain Management:  Regional for Post-op pain   Induction: Intravenous  PONV Risk Score and Plan: 2 and Ondansetron, Midazolam and Treatment may vary due to age or medical condition  Airway Management Planned: Oral ETT  Additional Equipment: None  Intra-op Plan:   Post-operative Plan: Extubation in OR  Informed Consent: I have reviewed the patients History and Physical, chart, labs and discussed the procedure including the risks, benefits and alternatives for the proposed anesthesia with the patient or authorized representative who has indicated his/her understanding and acceptance.     Dental advisory given  Plan Discussed with: CRNA  Anesthesia Plan Comments: ( )        Anesthesia Quick  Evaluation

## 2023-08-08 NOTE — Anesthesia Postprocedure Evaluation (Signed)
Anesthesia Post Note  Patient: Anthony Kane  Procedure(s) Performed: ANTERIOR CERVICAL DECOMPRESSION FUSION CERVICAL 4- CERVICAL 5, CERVICAL 5- CERVICAL 6, CERVICAL 6- CERVICAL 7 WITH INSTRUMENTATION AND ALLOGRAFT (Spine Cervical)     Patient location during evaluation: PACU Anesthesia Type: General Level of consciousness: awake and alert Pain management: pain level controlled Vital Signs Assessment: post-procedure vital signs reviewed and stable Respiratory status: spontaneous breathing, nonlabored ventilation and respiratory function stable Cardiovascular status: blood pressure returned to baseline and stable Postop Assessment: no apparent nausea or vomiting Anesthetic complications: no   No notable events documented.  Last Vitals:  Vitals:   08/07/23 1915 08/07/23 1930  BP: (!) 113/59 110/64  Pulse: 74 70  Resp: 14 14  Temp:  36.5 C  SpO2: 92% 92%    Last Pain:  Vitals:   08/07/23 1845  TempSrc:   PainSc: 6    Pain Goal: Patients Stated Pain Goal: 4 (08/07/23 1845)                 Lowella Curb

## 2023-08-09 ENCOUNTER — Encounter (HOSPITAL_COMMUNITY): Payer: Self-pay | Admitting: Orthopedic Surgery

## 2023-08-09 MED FILL — Thrombin For Soln 20000 Unit: CUTANEOUS | Qty: 1 | Status: AC

## 2023-08-09 NOTE — Progress Notes (Signed)
1mg  IV Dilaudid wasted.  Witnessed by this RN and Erby Pian.

## 2023-08-15 NOTE — Discharge Summary (Signed)
Patient ID: Anthony Kane MRN: [id number] DOB/AGE: [birth year] 72 y.o.  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 [facility])   Discharge Diagnoses:  Same  Past Medical History:  Diagnosis Date   Allergy    Arthritis    Shoulder and thumb   Cataract    History of blood transfusion 1983   History of hiatal hernia    Prediabetes    i was but i changed my diet and had a another check and they did say anything about it , denies     Surgeries: Procedure(s): ANTERIOR CERVICAL DECOMPRESSION FUSION CERVICAL 4- CERVICAL 5, CERVICAL 5- CERVICAL 6, CERVICAL 6- CERVICAL 7 WITH INSTRUMENTATION AND ALLOGRAFT on 08/07/2023   Consultants: None  Discharged Condition: Improved  Hospital Course: Anthony Kane is an 72 y.o. 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 Patient has severe unremitting pain that affects sleep, daily activities, and work/hobbies. After pre-op clearance the patient was taken to the operating room on 08/07/2023 and underwent  Procedure(s): ANTERIOR CERVICAL DECOMPRESSION FUSION CERVICAL 4- CERVICAL 5, CERVICAL 5- CERVICAL 6, CERVICAL 6- CERVICAL 7 WITH INSTRUMENTATION AND ALLOGRAFT.    Patient was given perioperative antibiotics:  Anti-infectives (From admission, onward)    Start     Dose/Rate Route Frequency Ordered Stop   08/07/23 1100  ceFAZolin (ANCEF) IVPB 2g/100 mL premix        2 g 200 mL/hr over 30 Minutes Intravenous On call to O.R. 08/07/23 1047 08/07/23 1427        Patient was given sequential compression devices, early ambulation to prevent DVT.  Patient benefited maximally from hospital stay and there were no complications.    Recent vital signs: BP 110/64 (BP Location: Right Arm)   Pulse 70   Temp 97.7 F (36.5 C)   Resp 14   Ht 6' (1.829 m)    Wt 107.7 kg   SpO2 92%   BMI 32.21 kg/m    Discharge Medications:   Allergies as of 08/07/2023   No Known Allergies      Medication List     STOP taking these medications    aspirin 81 MG chewable tablet   CLA PO   naproxen sodium 220 MG tablet Commonly known as: ALEVE   SAW PALMETTO PO       TAKE these medications    acetaminophen 500 MG tablet Commonly known as: TYLENOL Take 500 mg by mouth every 6 (six) hours as needed for moderate pain (pain score 4-6).   dorzolamide-timolol 2-0.5 % ophthalmic solution Commonly known as: COSOPT Place 1 drop into the right eye 2 (two) times daily.   fexofenadine 180 MG tablet Commonly known as: ALLEGRA Take 180 mg by mouth as needed for allergies or rhinitis (itching).   HYDROcodone-acetaminophen 5-325 MG tablet Commonly known as: NORCO/VICODIN Take 1 tablet by mouth every 6 (six) hours as needed for moderate pain (pain score 4-6) or severe pain (pain score 7-10). What changed:  how much to take reasons to take this   methocarbamol 750 MG tablet Commonly known as: ROBAXIN Take 1 tablet (750 mg total) by mouth every 6 (six) hours as needed for muscle spasms.   multivitamin with minerals Tabs tablet Take 3 tablets by mouth at bedtime. Catalyn Chewable by Standard Process   PRESERVISION AREDS 2 PO Take 1 capsule by mouth  in the morning and at bedtime.   sildenafil 20 MG tablet Commonly known as: REVATIO Take 20 mg by mouth daily as needed (erectile dysfunction).        Diagnostic Studies: DG Cervical Spine 1 View Result Date: 08/07/2023 CLINICAL DATA:  Anterior fusion EXAM: DG CERVICAL SPINE - 1 VIEW COMPARISON:  MRI 06/01/2023 FINDINGS: Three low resolution intraoperative spot views of the cervical spine. Total fluoroscopy time was 34 seconds, fluoroscopic dose of 18.17 mGy. The images demonstrate anterior plate, fixating screws and interbody device from C4 through C7. IMPRESSION: Intraoperative fluoroscopic  assistance provided during cervical spine surgery. Electronically Signed   By: Jasmine Pang M.D.   On: 08/07/2023 21:03   DG C-Arm 1-60 Min-No Report Result Date: 08/07/2023 Fluoroscopy was utilized by the requesting physician.  No radiographic interpretation.   DG C-Arm 1-60 Min-No Report Result Date: 08/07/2023 Fluoroscopy was utilized by the requesting physician.  No radiographic interpretation.   DG C-Arm 1-60 Min-No Report Result Date: 08/07/2023 Fluoroscopy was utilized by the requesting physician.  No radiographic interpretation.    Disposition: Discharge disposition: 01-Home or Self Care       Discharge Instructions     Discharge patient   Complete by: As directed    Discharge disposition: 01-Home or Self Care   Discharge patient date: 08/07/2023      S/P ACDF 3 level today -Scripts for pain sent to pharmacy electronically  -D/C instructions sheet printed and in chart -D/C today  -F/U in office 2 weeks   Signed: Eilene Ghazi Angelys Yetman 08/15/2023, 12:21 PM

## 2023-11-07 ENCOUNTER — Encounter: Payer: Self-pay | Admitting: Internal Medicine

## 2023-11-27 ENCOUNTER — Encounter

## 2023-12-11 ENCOUNTER — Ambulatory Visit (AMBULATORY_SURGERY_CENTER)

## 2023-12-11 VITALS — Ht 72.0 in | Wt 230.0 lb

## 2023-12-11 DIAGNOSIS — Z860100 Personal history of colon polyps, unspecified: Secondary | ICD-10-CM

## 2023-12-11 MED ORDER — NA SULFATE-K SULFATE-MG SULF 17.5-3.13-1.6 GM/177ML PO SOLN: 1.0000 | Freq: Once | ORAL | 0 refills | Status: AC

## 2023-12-11 NOTE — Progress Notes (Signed)

## 2023-12-17 ENCOUNTER — Encounter: Payer: Self-pay | Admitting: Internal Medicine

## 2023-12-19 ENCOUNTER — Encounter: Payer: Self-pay | Admitting: Internal Medicine

## 2023-12-19 ENCOUNTER — Ambulatory Visit: Admitting: Internal Medicine

## 2023-12-19 VITALS — BP 126/75 | HR 83 | Resp 11

## 2023-12-19 DIAGNOSIS — Z1211 Encounter for screening for malignant neoplasm of colon: Secondary | ICD-10-CM | POA: Diagnosis not present

## 2023-12-19 DIAGNOSIS — K648 Other hemorrhoids: Secondary | ICD-10-CM | POA: Diagnosis not present

## 2023-12-19 DIAGNOSIS — Z860100 Personal history of colon polyps, unspecified: Secondary | ICD-10-CM

## 2023-12-19 MED ORDER — SODIUM CHLORIDE 0.9 % IV SOLN: 500.0000 mL | INTRAVENOUS | Status: DC

## 2023-12-19 NOTE — Patient Instructions (Signed)

## 2023-12-19 NOTE — Op Note (Signed)
 Anthony Kane Patient Name: Anthony Kane Procedure Date: 12/19/2023 11:15 AM MRN: [id number] Endoscopist: Wilhemina Bonito. Anthony Kane , MD, [id number] Age: 73 Referring MD:  Date of Birth: 06/15/1951 Gender: Male Account #: [id number] Procedure:                Colonoscopy Indications:              High risk colon cancer surveillance: Personal                            history of non-advanced adenoma. Previous                            examinations in [place] (15 years ago); 2019 Medicines:                Monitored Anesthesia Care Procedure:                Pre-Anesthesia Assessment:                           - Prior to the procedure, a History and Physical                            was performed, and patient medications and                            allergies were reviewed. The patient's tolerance of                            previous anesthesia was also reviewed. The risks                            and benefits of the procedure and the sedation                            options and risks were discussed with the patient.                            All questions were answered, and informed consent                            was obtained. Prior Anticoagulants: The patient has                            taken no anticoagulant or antiplatelet agents. ASA                            Grade Assessment: II - A patient with mild systemic                            disease. After reviewing the risks and benefits,                            the patient was deemed in satisfactory condition to  undergo the procedure.                           After obtaining informed consent, the colonoscope                            was passed under direct vision. Throughout the                            procedure, the patient's blood pressure, pulse, and                            oxygen saturations were monitored continuously. The                            CF [id number] #[id number] was  introduced through the anus                            and advanced to the the cecum, identified by                            appendiceal orifice and ileocecal valve. The                            ileocecal valve, appendiceal orifice, and rectum                            were photographed. The quality of the bowel                            preparation was excellent. The colonoscopy was                            performed without difficulty. The patient tolerated                            the procedure well. The bowel preparation used was                            SUPREP via split dose instruction. Scope In: 11:31:49 AM Scope Out: 11:44:27 AM Scope Withdrawal Time: 0 hours 7 minutes 14 seconds  Total Procedure Duration: 0 hours 12 minutes 38 seconds  Findings:                 Internal hemorrhoids were found during retroflexion.                           The entire examined colon appeared normal on direct                            and retroflexion views. Complications:            No immediate complications. Estimated blood loss:  None. Estimated Blood Loss:     Estimated blood loss: none. Impression:               - Internal hemorrhoids.                           - The entire examined colon is normal on direct and                            retroflexion views.                           - No specimens collected. Recommendation:           - Repeat colonoscopy is not recommended for                            surveillance.                           - Patient has a contact number available for                            emergencies. The signs and symptoms of potential                            delayed complications were discussed with the                            patient. Return to normal activities tomorrow.                            Written discharge instructions were provided to the                            patient.                           -  Resume previous diet.                           - Continue present medications. Anthony Kane. Anthony Hammer, MD 12/19/2023 11:51:34 AM This report has been signed electronically.

## 2023-12-19 NOTE — Progress Notes (Signed)
 Sedate, gd SR, tolerated procedure well, VSS, report to RN

## 2023-12-19 NOTE — Progress Notes (Signed)
 Pt's states no medical or surgical changes since previsit or office visit.

## 2023-12-19 NOTE — Progress Notes (Signed)
 HISTORY OF PRESENT ILLNESS:  Anthony Kane is a 73 y.o. male who presents today for surveillance colonoscopy given a history of adenomatous colon polyps.  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   ANTERIOR CERVICAL DECOMPRESSION/DISCECTOMY FUSION 4 LEVELS N/A 08/07/2023   Procedure: ANTERIOR CERVICAL DECOMPRESSION FUSION CERVICAL 4- CERVICAL 5, CERVICAL 5- CERVICAL 6, CERVICAL 6- CERVICAL 7 WITH INSTRUMENTATION AND ALLOGRAFT;  Surgeon: Virl Grimes, MD;  Location: [facility] OR;  Service: Orthopedics;  Laterality: N/A;   BUNIONECTOMY Left    BUNIONECTOMY Right    COLONOSCOPY  2019   EYE SURGERY Bilateral    intraocular lens- cataract   HAMMER TOE SURGERY Right    HAND SURGERY Right    x8   KNEE ARTHROPLASTY Right 2010   KNEE ARTHROSCOPY Bilateral    x2 on each knee   SHOULDER OPEN ROTATOR CUFF REPAIR Right 05/08/2013   Procedure: RIGHT SHOULDER OPEN ROTATOR CUFF REPAIR with ANCHORS;  Surgeon: Florencia Hunter, MD;  Location: [facility];  Service: Orthopedics;  Laterality: Right;   TONSILLECTOMY  age 60   TOTAL KNEE ARTHROPLASTY Left 05/26/2019   Procedure: LEFT TOTAL KNEE ARTHROPLASTY;  Surgeon: Dayne Even, MD;  Location: [facility];  Service: Orthopedics;  Laterality: Left;   TOTAL SHOULDER ARTHROPLASTY Left 08/17/2016   Procedure: left reverse shoulder arthroplasty;  Surgeon: Winston Hawking, MD;  Location: [facility] OR;  Service: Orthopedics;  Laterality: Left;    Social History Anthony Kane  reports that he has never smoked. He has never used smokeless tobacco. He reports that he does not  currently use alcohol. He reports that he does not use drugs.  family history includes Esophageal cancer in his brother.  No Known Allergies     PHYSICAL EXAMINATION: Vital signs: There were no vitals taken for this visit. General: Well-developed, well-nourished, no acute distress HEENT: Sclerae are anicteric, conjunctiva pink. Oral mucosa intact Lungs: Clear Heart: Regular Abdomen: soft, nontender, nondistended, no obvious ascites, no peritoneal signs, normal bowel sounds. No organomegaly. Extremities: No edema Psychiatric: alert and oriented x3. Cooperative     ASSESSMENT:  History of adenomatous colon polyp 2019   PLAN:  Surveillance colonoscopy

## 2023-12-19 NOTE — Progress Notes (Signed)
 Patient passing a significant amount of gas. He states his abdominal pain has subsided. Discharged without any concerns.

## 2023-12-23 ENCOUNTER — Telehealth: Payer: Self-pay

## 2023-12-23 NOTE — Telephone Encounter (Signed)
  Follow up Call-     12/19/2023   10:30 AM  Call back number  Post procedure Call Back phone  # [phone]  Permission to leave phone message Yes     Patient questions:  Do you have a fever, pain , or abdominal swelling? No. Pain Score  0 *  Have you tolerated food without any problems? Yes.    Have you been able to return to your normal activities? Yes.    Do you have any questions about your discharge instructions: Diet   No. Medications  No. Follow up visit  No.  Do you have questions or concerns about your Care? No.  Actions: * If pain score is 4 or above: No action needed, pain <4.

## 2024-07-24 ENCOUNTER — Other Ambulatory Visit (HOSPITAL_COMMUNITY): Payer: Self-pay | Admitting: Orthopaedic Surgery

## 2024-07-24 DIAGNOSIS — Z96652 Presence of left artificial knee joint: Secondary | ICD-10-CM

## 2024-07-27 ENCOUNTER — Encounter (HOSPITAL_COMMUNITY)
Admission: RE | Admit: 2024-07-27 | Discharge: 2024-07-27 | Disposition: A | Discharge status: Home / Self Care | Source: Ambulatory Visit | Attending: Orthopaedic Surgery | Admitting: Orthopaedic Surgery

## 2024-07-27 DIAGNOSIS — Z96652 Presence of left artificial knee joint: Secondary | ICD-10-CM | POA: Insufficient documentation

## 2024-07-27 MED ORDER — TECHNETIUM TC 99M MEDRONATE IV KIT
20.0000 | PACK | Freq: Once | INTRAVENOUS | Status: AC | PRN
  Administered 2024-07-27: 20.5 via INTRAVENOUS
